# Patient Record
Sex: Female | Born: 1995 | Race: Black or African American | Hispanic: No | Marital: Single | Smoking: Former smoker
Health system: Southern US, Community
[De-identification: ages and names within clinical notes are randomized; demographics above are authoritative.]

## PROBLEM LIST (undated history)

## (undated) ENCOUNTER — Inpatient Hospital Stay (HOSPITAL_COMMUNITY): Payer: Self-pay

## (undated) DIAGNOSIS — B20 Human immunodeficiency virus [HIV] disease: Secondary | ICD-10-CM

## (undated) DIAGNOSIS — R000 Tachycardia, unspecified: Secondary | ICD-10-CM

## (undated) HISTORY — PX: NO PAST SURGERIES: SHX2092

---
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
## 2015-02-03 NOTE — L&D Delivery Note (Signed)
Delivery Note Went to check on patient. She was having more pressure in her bottom. We performed a few test pushes with good movement. Given pre-term, NICU was notified.  At 10:59 PM a viable female was delivered via Vaginal, Spontaneous Delivery (Presentation: ; Occiput Anterior). Infant was dusky without a cry on delivery. Umbilical cord was immediately clamped x2 and cut per neonatologist's prior request and infant was transferred to warmer immediately for evaluation/management.   APGAR: 3, 7; weight: 1.96kg.   Placenta status: Intact, Spontaneous, some concerns for abruption given the appearance of the placenta  Pathology.  Cord: 3 vessels with the following complications: None.  Cord pH: 7.037  Anesthesia: Epidural  Episiotomy:  N/A Lacerations:  N/A  Suture Repair: N/A Est. Blood Loss (mL):   Mom to postpartum.  Baby to NICU. Placenta to pathology.   Crystal Dorsey 03/27/2015, 11:45 PM  Patient is a G1 at [redacted]w[redacted]d who was admitted w/ preterm labor, significant hx of HIV and scant prenatal care.  She progressed without augmentation however her membranes were artificially ruptured once she was 9cm and they were bulging from her introitus.  I was gloved and present for delivery in its entirety.  Second stage of labor progressed,  mild decels during second stage noted.  Complications: none; cord clamped and cut and infant to awaiting peds team for eval and tx  Lacerations: none  EBL: 500cc  SHAW, KIMBERLY, CNM 7:33 PM

## 2015-02-03 NOTE — L&D Delivery Note (Signed)
Delivery Note At 11:00 PM a viable female was delivered via Vaginal, Spontaneous Delivery (Presentation: vertex ; LOA  ).  APGAR: 8, 9; weight pending .   Placenta status: intact , with gentle traction .  Cord: 3 vessel with the following complications: none.  Cord pH: not collected  Anesthesia:  epidrual Episiotomy: None Lacerations: Periurethral Est. Blood Loss (mL): 100  Mom to postpartum.  Baby to Couplet care / Skin to Skin.  Ernestina Pennaicholas Pattye Meda 01/04/2016, 11:32 PM

## 2015-03-02 ENCOUNTER — Encounter (HOSPITAL_COMMUNITY): Payer: Self-pay | Admitting: *Deleted

## 2015-03-02 ENCOUNTER — Inpatient Hospital Stay (HOSPITAL_COMMUNITY)
Admission: AD | Admit: 2015-03-02 | Discharge: 2015-03-02 | Disposition: A | Discharge status: Home / Self Care | Payer: Medicaid Other | Source: Ambulatory Visit | Attending: Obstetrics & Gynecology | Admitting: Obstetrics & Gynecology

## 2015-03-02 DIAGNOSIS — O98813 Other maternal infectious and parasitic diseases complicating pregnancy, third trimester: Secondary | ICD-10-CM | POA: Insufficient documentation

## 2015-03-02 DIAGNOSIS — Z21 Asymptomatic human immunodeficiency virus [HIV] infection status: Secondary | ICD-10-CM | POA: Diagnosis not present

## 2015-03-02 DIAGNOSIS — O9989 Other specified diseases and conditions complicating pregnancy, childbirth and the puerperium: Secondary | ICD-10-CM | POA: Diagnosis not present

## 2015-03-02 DIAGNOSIS — B373 Candidiasis of vulva and vagina: Secondary | ICD-10-CM | POA: Insufficient documentation

## 2015-03-02 DIAGNOSIS — Z87891 Personal history of nicotine dependence: Secondary | ICD-10-CM | POA: Insufficient documentation

## 2015-03-02 DIAGNOSIS — R109 Unspecified abdominal pain: Secondary | ICD-10-CM

## 2015-03-02 DIAGNOSIS — O98713 Human immunodeficiency virus [HIV] disease complicating pregnancy, third trimester: Secondary | ICD-10-CM | POA: Insufficient documentation

## 2015-03-02 DIAGNOSIS — O26899 Other specified pregnancy related conditions, unspecified trimester: Secondary | ICD-10-CM

## 2015-03-02 DIAGNOSIS — Z3A29 29 weeks gestation of pregnancy: Secondary | ICD-10-CM | POA: Diagnosis not present

## 2015-03-02 DIAGNOSIS — B3731 Acute candidiasis of vulva and vagina: Secondary | ICD-10-CM

## 2015-03-02 HISTORY — DX: Human immunodeficiency virus (HIV) disease: B20

## 2015-03-02 LAB — URINALYSIS, ROUTINE W REFLEX MICROSCOPIC
BILIRUBIN URINE: NEGATIVE
GLUCOSE, UA: NEGATIVE mg/dL
HGB URINE DIPSTICK: NEGATIVE
Ketones, ur: 15 mg/dL — AB
NITRITE: NEGATIVE
PH: 6.5 (ref 5.0–8.0)
Protein, ur: NEGATIVE mg/dL
SPECIFIC GRAVITY, URINE: 1.015 (ref 1.005–1.030)

## 2015-03-02 LAB — WET PREP, GENITAL
CLUE CELLS WET PREP: NONE SEEN
SPERM: NONE SEEN
Trich, Wet Prep: NONE SEEN

## 2015-03-02 LAB — URINE MICROSCOPIC-ADD ON

## 2015-03-02 NOTE — MAU Provider Note (Signed)
MAU HISTORY AND PHYSICAL  Chief Complaint:  abd pain  Sherry Robbins is a 20 y.o.  G1P0 with IUP at [redacted]w[redacted]d by 1st tri u/s (per patient) presenting for abd pain.  Has been cared for this pregnancy at [facility], but wants to transfer care to something in the area.  Upset stomach for a few days. Like urge to defecate. Loose stools for few days. Three times in one day, one stool today. No pain currently. Having intermittent contractions for a few weeks, few times a day. Positive fetal movements, no LOF, no bleeding. No fevers, no vomiting. No blood in stool. No current concerns. No dysuria or hematuria. No back pain.  On medication for HIV, followed by ID @ [facility] in [place].  Nothing in vagina past 24 hours.  Does cite increase in vaginal discharge past few weeks. No pain or burning.    Past Medical History  Diagnosis Date  . HIV disease [facility])     Past Surgical History  Procedure Laterality Date  . No past surgeries      Family History  Problem Relation Age of Onset  . Cancer Maternal Grandmother     Social History  Substance Use Topics  . Smoking status: Former Smoker    Quit date: 08/30/2014  . Smokeless tobacco: None  . Alcohol Use: Yes     Comment: social    Allergies  Allergen Reactions  . Ceftriaxone Other (See Comments)    Reaction:  Fever and cytopenias  . Cephalexin Other (See Comments)    Reaction:  Fever and cytopenias  . Zithromax [Azithromycin] Other (See Comments)    Reaction:  Fever and cytopenias    Prescriptions prior to admission  Medication Sig Dispense Refill Last Dose  . emtricitabine-tenofovir AF (DESCOVY) 200-25 MG tablet Take 1 tablet by mouth daily with supper.    03/01/2015 at Unknown time  . Prenatal Vit-Min-FA-Fish Oil ([facility] PRENATAL GUMMY PO) Take 2 each by mouth daily.   Past Week at Unknown time  . rilpivirine (EDURANT) 25 MG TABS tablet Take 25 mg by mouth daily with supper.   03/01/2015 at Unknown time    Review of Systems -  Negative except for what is mentioned in HPI.  Physical Exam  Blood pressure 119/67, pulse 104, temperature 98.5 F (36.9 C), temperature source Oral, resp. rate 16, last menstrual period 04/27/2014. GENERAL: Well-developed, well-nourished female in no acute distress.  LUNGS: Clear to auscultation bilaterally.  HEART: Regular rate and rhythm. ABDOMEN: Soft, nontender, nondistended, gravid.  EXTREMITIES: Nontender, no edema, 2+ distal pulses. Cervical Exam: closed/thick/high FHT:  135/mod/+a/-d Contractions: quiet   Labs: Results for orders placed or performed during the hospital encounter of 03/02/15 (from the past 24 hour(s))  Urinalysis, Routine w reflex microscopic (not at [facility])   Collection Time: 03/02/15  4:13 PM  Result Value Ref Range   Color, Urine YELLOW YELLOW   APPearance CLEAR CLEAR   Specific Gravity, Urine 1.015 1.005 - 1.030   pH 6.5 5.0 - 8.0   Glucose, UA NEGATIVE NEGATIVE mg/dL   Hgb urine dipstick NEGATIVE NEGATIVE   Bilirubin Urine NEGATIVE NEGATIVE   Ketones, ur 15 (A) NEGATIVE mg/dL   Protein, ur NEGATIVE NEGATIVE mg/dL   Nitrite NEGATIVE NEGATIVE   Leukocytes, UA SMALL (A) NEGATIVE  Urine microscopic-add on   Collection Time: 03/02/15  4:13 PM  Result Value Ref Range   Squamous Epithelial / LPF 0-5 (A) NONE SEEN   WBC, UA 6-30 0 - 5 WBC/hpf  RBC / HPF 0-5 0 - 5 RBC/hpf   Bacteria, UA FEW (A) NONE SEEN  Wet prep, genital   Collection Time: 03/02/15  4:15 PM  Result Value Ref Range   Yeast Wet Prep HPF POC PRESENT (A) NONE SEEN   Trich, Wet Prep NONE SEEN NONE SEEN   Clue Cells Wet Prep HPF POC NONE SEEN NONE SEEN   WBC, Wet Prep HPF POC MANY (A) NONE SEEN   Sperm NONE SEEN     Imaging Studies:  No results found.  Assessment: Sherry Robbins is  20 y.o. G1P0 at [redacted]w[redacted]d presents with recent abdominal pain and loose stools. Today no abdominal pain, stooling normally. Etiology unclear but appears to be resolved. Think unlikely PTL as does not  describe regular contractions, and cervix closed on exam. Uterus not tender and no vaginal bleeding, so unlikely abruption. Absence of pain today makes other serious intraabdominal processes, such as appendicitis or ovarian torsion, unlikely. Does not describe symptoms consistent with cystitis or pyelonephritis, and urinalysis today not suggestive of infection. Wet prep performed given increased vaginal discharge, showing yeast. NST reactive.  Plan: - d/c home with abdominal pain in pregnancy return precautions - referring to our high risk clinic given HIV positive status - OTC monostat for yeast infection  Silvano Bilis 1/28/20174:46 PM

## 2015-03-02 NOTE — MAU Note (Signed)
Pt had an upset stomach and experienced some tightening.  Diarrhea x2 days. Denies VB/LOF.

## 2015-03-02 NOTE — Discharge Instructions (Signed)
Monilial Vaginitis Vaginitis in a soreness, swelling and redness (inflammation) of the vagina and vulva. Monilial vaginitis is not a sexually transmitted infection. CAUSES  Yeast vaginitis is caused by yeast (candida) that is normally found in your vagina. With a yeast infection, the candida has overgrown in number to a point that upsets the chemical balance. SYMPTOMS   White, thick vaginal discharge.  Swelling, itching, redness and irritation of the vagina and possibly the lips of the vagina (vulva).  Burning or painful urination.  Painful intercourse. DIAGNOSIS  Things that may contribute to monilial vaginitis are:  Postmenopausal and virginal states.  Pregnancy.  Infections.  Being tired, sick or stressed, especially if you had monilial vaginitis in the past.  Diabetes. Good control will help lower the chance.  Birth control pills.  Tight fitting garments.  Using bubble bath, feminine sprays, douches or deodorant tampons.  Taking certain medications that kill germs (antibiotics).  Sporadic recurrence can occur if you become ill. TREATMENT  Your caregiver will give you medication.  There are several kinds of anti monilial vaginal creams and suppositories specific for monilial vaginitis. For recurrent yeast infections, use a suppository or cream in the vagina 2 times a week, or as directed.  Anti-monilial or steroid cream for the itching or irritation of the vulva may also be used. Get your caregiver's permission.  Painting the vagina with methylene blue solution may help if the monilial cream does not work.  Eating yogurt may help prevent monilial vaginitis. HOME CARE INSTRUCTIONS   Finish all medication as prescribed.  Do not have sex until treatment is completed or after your caregiver tells you it is okay.  Take warm sitz baths.  Do not douche.  Do not use tampons, especially scented ones.  Wear cotton underwear.  Avoid tight pants and panty  hose.  Tell your sexual partner that you have a yeast infection. They should go to their caregiver if they have symptoms such as mild rash or itching.  Your sexual partner should be treated as well if your infection is difficult to eliminate.  Practice safer sex. Use condoms.  Some vaginal medications cause latex condoms to fail. Vaginal medications that harm condoms are:  Cleocin cream.  Butoconazole (Femstat).  Terconazole (Terazol) vaginal suppository.  Miconazole (Monistat) (may be purchased over the counter). SEEK MEDICAL CARE IF:   You have a temperature by mouth above 102 F (38.9 C).  The infection is getting worse after 2 days of treatment.  The infection is not getting better after 3 days of treatment.  You develop blisters in or around your vagina.  You develop vaginal bleeding, and it is not your menstrual period.  You have pain when you urinate.  You develop intestinal problems.  You have pain with sexual intercourse.   This information is not intended to replace advice given to you by your health care provider. Make sure you discuss any questions you have with your health care provider.   Document Released: 10/29/2004 Document Revised: 04/13/2011 Document Reviewed: 07/23/2014 Elsevier Interactive Patient Education 2016 [employer] Abdominal Pain During Pregnancy Belly (abdominal) pain is common during pregnancy. Most of the time, it is not a serious problem. Other times, it can be a sign that something is wrong with the pregnancy. Always tell your doctor if you have belly pain. HOME CARE Monitor your belly pain for any changes. The following actions may help you feel better:  Do not have sex (intercourse) or put anything in your vagina until  until you feel better. °· Rest until your pain stops. °· Drink clear fluids if you feel sick to your stomach (nauseous). Do not eat solid food until you feel better. °· Only take medicine as told by your  doctor. °· Keep all doctor visits as told. °GET HELP RIGHT AWAY IF:  °· You are bleeding, leaking fluid, or pieces of tissue come out of your vagina. °· You have more pain or cramping. °· You keep throwing up (vomiting). °· You have pain when you pee (urinate) or have blood in your pee. °· You have a fever. °· You do not feel your baby moving as much. °· You feel very weak or feel like passing out. °· You have trouble breathing, with or without belly pain. °· You have a very bad headache and belly pain. °· You have fluid leaking from your vagina and belly pain. °· You keep having watery poop (diarrhea). °· Your belly pain does not go away after resting, or the pain gets worse. °MAKE SURE YOU:  °· Understand these instructions. °· Will watch your condition. °· Will get help right away if you are not doing well or get worse. °  °This information is not intended to replace advice given to you by your health care provider. Make sure you discuss any questions you have with your health care provider. °  °Document Released: 01/07/2009 Document Revised: 09/21/2012 Document Reviewed: 08/18/2012 °Elsevier Interactive Patient Education ©2016 Elsevier Inc. ° °

## 2015-03-27 ENCOUNTER — Inpatient Hospital Stay (HOSPITAL_COMMUNITY)
Admission: AD | Admit: 2015-03-27 | Discharge: 2015-03-29 | DRG: 774 | Disposition: A | Discharge status: Home / Self Care | Payer: Medicaid Other | Source: Ambulatory Visit | Attending: Obstetrics and Gynecology | Admitting: Obstetrics and Gynecology

## 2015-03-27 ENCOUNTER — Inpatient Hospital Stay (HOSPITAL_COMMUNITY): Payer: Medicaid Other | Admitting: Anesthesiology

## 2015-03-27 ENCOUNTER — Encounter (HOSPITAL_COMMUNITY): Payer: Self-pay | Admitting: *Deleted

## 2015-03-27 DIAGNOSIS — O458X3 Other premature separation of placenta, third trimester: Secondary | ICD-10-CM | POA: Diagnosis present

## 2015-03-27 DIAGNOSIS — O9812 Syphilis complicating childbirth: Secondary | ICD-10-CM | POA: Diagnosis present

## 2015-03-27 DIAGNOSIS — O9902 Anemia complicating childbirth: Secondary | ICD-10-CM | POA: Diagnosis present

## 2015-03-27 DIAGNOSIS — O98713 Human immunodeficiency virus [HIV] disease complicating pregnancy, third trimester: Secondary | ICD-10-CM | POA: Diagnosis present

## 2015-03-27 DIAGNOSIS — B20 Human immunodeficiency virus [HIV] disease: Secondary | ICD-10-CM

## 2015-03-27 DIAGNOSIS — Z87891 Personal history of nicotine dependence: Secondary | ICD-10-CM

## 2015-03-27 DIAGNOSIS — Z3A33 33 weeks gestation of pregnancy: Secondary | ICD-10-CM

## 2015-03-27 DIAGNOSIS — Z21 Asymptomatic human immunodeficiency virus [HIV] infection status: Secondary | ICD-10-CM | POA: Diagnosis present

## 2015-03-27 HISTORY — DX: Tachycardia, unspecified: R00.0

## 2015-03-27 LAB — URINALYSIS, ROUTINE W REFLEX MICROSCOPIC
Bilirubin Urine: NEGATIVE
GLUCOSE, UA: NEGATIVE mg/dL
Ketones, ur: NEGATIVE mg/dL
Nitrite: NEGATIVE
PH: 5 (ref 5.0–8.0)
PROTEIN: 30 mg/dL — AB
SPECIFIC GRAVITY, URINE: 1.025 (ref 1.005–1.030)

## 2015-03-27 LAB — URINE MICROSCOPIC-ADD ON

## 2015-03-27 LAB — BASIC METABOLIC PANEL
ANION GAP: 10 (ref 5–15)
CHLORIDE: 108 mmol/L (ref 101–111)
CO2: 19 mmol/L — ABNORMAL LOW (ref 22–32)
Calcium: 8.3 mg/dL — ABNORMAL LOW (ref 8.9–10.3)
Creatinine, Ser: 0.52 mg/dL (ref 0.44–1.00)
GFR calc Af Amer: >60 mL/min (ref >60)
Glucose, Bld: 106 mg/dL — ABNORMAL HIGH (ref 65–99)
POTASSIUM: 3.4 mmol/L — AB (ref 3.5–5.1)
SODIUM: 137 mmol/L (ref 135–145)

## 2015-03-27 LAB — FERRITIN: FERRITIN: 7 ng/mL — AB (ref 11–307)

## 2015-03-27 LAB — CBC
HCT: 25.1 % — ABNORMAL LOW (ref 36.0–46.0)
Hemoglobin: 7.4 g/dL — ABNORMAL LOW (ref 12.0–15.0)
MCH: 19.1 pg — ABNORMAL LOW (ref 26.0–34.0)
MCHC: 29.5 g/dL — ABNORMAL LOW (ref 30.0–36.0)
MCV: 64.7 fL — ABNORMAL LOW (ref 78.0–100.0)
PLATELETS: 409 10*3/uL — AB (ref 150–400)
RBC: 3.88 MIL/uL (ref 3.87–5.11)
RDW: 19.6 % — AB (ref 11.5–15.5)
WBC: 10.9 10*3/uL — AB (ref 4.0–10.5)

## 2015-03-27 LAB — OB RESULTS CONSOLE GBS: STREP GROUP B AG: NEGATIVE

## 2015-03-27 LAB — PREPARE RBC (CROSSMATCH)

## 2015-03-27 MED ORDER — ONDANSETRON HCL 4 MG/2ML IJ SOLN
4.0000 mg | Freq: Four times a day (QID) | INTRAMUSCULAR | Status: DC
  Filled 2015-03-27: qty 2

## 2015-03-27 MED ORDER — LACTATED RINGERS IV SOLN
500.0000 mL | INTRAVENOUS | Status: DC | PRN
  Administered 2015-03-27: 300 mL via INTRAVENOUS
  Administered 2015-03-27: 500 mL via INTRAVENOUS

## 2015-03-27 MED ORDER — MAGNESIUM SULFATE 50 % IJ SOLN
2.5000 g/h | INTRAMUSCULAR | Status: DC
  Filled 2015-03-27: qty 80

## 2015-03-27 MED ORDER — EPHEDRINE 5 MG/ML INJ
10.0000 mg | INTRAVENOUS | Status: DC | PRN
  Filled 2015-03-27: qty 2

## 2015-03-27 MED ORDER — PHENYLEPHRINE 40 MCG/ML (10ML) SYRINGE FOR IV PUSH (FOR BLOOD PRESSURE SUPPORT)
80.0000 ug | PREFILLED_SYRINGE | INTRAVENOUS | Status: DC | PRN
  Filled 2015-03-27: qty 2
  Filled 2015-03-27: qty 20

## 2015-03-27 MED ORDER — LIDOCAINE HCL (PF) 1 % IJ SOLN
30.0000 mL | INTRAMUSCULAR | Status: DC | PRN
  Filled 2015-03-27: qty 30

## 2015-03-27 MED ORDER — SODIUM CHLORIDE 0.9 % IV SOLN
1.0000 g | INTRAVENOUS | Status: DC
  Administered 2015-03-27 (×2): 1 g via INTRAVENOUS
  Filled 2015-03-27 (×7): qty 1000

## 2015-03-27 MED ORDER — PHENYLEPHRINE 40 MCG/ML (10ML) SYRINGE FOR IV PUSH (FOR BLOOD PRESSURE SUPPORT)
80.0000 ug | PREFILLED_SYRINGE | INTRAVENOUS | Status: DC | PRN
  Filled 2015-03-27: qty 2

## 2015-03-27 MED ORDER — CITRIC ACID-SODIUM CITRATE 334-500 MG/5ML PO SOLN: 30.0000 mL | ORAL | Status: DC | PRN

## 2015-03-27 MED ORDER — NIFEDIPINE 10 MG PO CAPS: 20.0000 mg | ORAL_CAPSULE | Freq: Four times a day (QID) | ORAL | Status: DC

## 2015-03-27 MED ORDER — LIDOCAINE HCL (PF) 1 % IJ SOLN
INTRAMUSCULAR | Status: DC | PRN
  Administered 2015-03-27: 6 mL
  Administered 2015-03-27: 5 mL

## 2015-03-27 MED ORDER — EMTRICITABINE-TENOFOVIR AF 200-25 MG PO TABS
1.0000 | ORAL_TABLET | Freq: Every day | ORAL | Status: DC
  Administered 2015-03-27 – 2015-03-29 (×3): 1 via ORAL
  Filled 2015-03-27 (×3): qty 1

## 2015-03-27 MED ORDER — MISOPROSTOL 200 MCG PO TABS
800.0000 ug | ORAL_TABLET | Freq: Once | ORAL | Status: AC
  Administered 2015-03-27: 800 ug via ORAL

## 2015-03-27 MED ORDER — OXYTOCIN BOLUS FROM INFUSION
500.0000 mL | INTRAVENOUS | Status: DC
  Administered 2015-03-27: 500 mL via INTRAVENOUS

## 2015-03-27 MED ORDER — LABETALOL HCL 5 MG/ML IV SOLN: 20.0000 mg | INTRAVENOUS | Status: DC | PRN

## 2015-03-27 MED ORDER — MISOPROSTOL 200 MCG PO TABS
ORAL_TABLET | ORAL | Status: AC
  Filled 2015-03-27: qty 4

## 2015-03-27 MED ORDER — LACTATED RINGERS IV SOLN
INTRAVENOUS | Status: DC
  Administered 2015-03-27: 11:00:00 via INTRAVENOUS

## 2015-03-27 MED ORDER — LACTATED RINGERS IV SOLN: 500.0000 mL | Freq: Once | INTRAVENOUS | Status: DC

## 2015-03-27 MED ORDER — ACETAMINOPHEN 325 MG PO TABS: 650.0000 mg | ORAL_TABLET | ORAL | Status: DC | PRN

## 2015-03-27 MED ORDER — RILPIVIRINE HCL 25 MG PO TABS
25.0000 mg | ORAL_TABLET | Freq: Every day | ORAL | Status: DC
  Administered 2015-03-27 – 2015-03-29 (×3): 25 mg via ORAL
  Filled 2015-03-27 (×3): qty 1

## 2015-03-27 MED ORDER — FENTANYL CITRATE (PF) 100 MCG/2ML IJ SOLN
100.0000 ug | INTRAMUSCULAR | Status: DC | PRN
  Administered 2015-03-27: 100 ug via INTRAVENOUS
  Filled 2015-03-27: qty 2

## 2015-03-27 MED ORDER — OXYTOCIN 10 UNIT/ML IJ SOLN
2.5000 [IU]/h | INTRAVENOUS | Status: DC
  Filled 2015-03-27: qty 4

## 2015-03-27 MED ORDER — DIPHENHYDRAMINE HCL 50 MG/ML IJ SOLN: 12.5000 mg | INTRAMUSCULAR | Status: DC | PRN

## 2015-03-27 MED ORDER — LACTATED RINGERS IV SOLN
INTRAVENOUS | Status: DC
  Administered 2015-03-27: 16:00:00 via INTRAVENOUS

## 2015-03-27 MED ORDER — ZIDOVUDINE 10 MG/ML IV SOLN
1.0000 mg/kg/h | INTRAVENOUS | Status: DC
  Administered 2015-03-27 (×2): 1 mg/kg/h via INTRAVENOUS
  Filled 2015-03-27 (×2): qty 40

## 2015-03-27 MED ORDER — ZIDOVUDINE 10 MG/ML IV SOLN
2.0000 mg/kg | Freq: Once | INTRAVENOUS | Status: AC
  Administered 2015-03-27: 112 mg via INTRAVENOUS
  Filled 2015-03-27: qty 11.2

## 2015-03-27 MED ORDER — BETAMETHASONE SOD PHOS & ACET 6 (3-3) MG/ML IJ SUSP
12.0000 mg | INTRAMUSCULAR | Status: DC
  Administered 2015-03-27: 12 mg via INTRAMUSCULAR
  Filled 2015-03-27 (×2): qty 2

## 2015-03-27 MED ORDER — MAGNESIUM SULFATE BOLUS VIA INFUSION
6.0000 g | Freq: Once | INTRAVENOUS | Status: AC
Start: 2015-03-27 — End: 2015-03-27
  Administered 2015-03-27: 6 g via INTRAVENOUS
  Filled 2015-03-27: qty 500

## 2015-03-27 MED ORDER — ONDANSETRON HCL 4 MG/2ML IJ SOLN: 4.0000 mg | Freq: Four times a day (QID) | INTRAMUSCULAR | Status: DC | PRN

## 2015-03-27 MED ORDER — SODIUM CHLORIDE 0.9 % IV SOLN
2.0000 g | Freq: Once | INTRAVENOUS | Status: AC
  Administered 2015-03-27: 2 g via INTRAVENOUS
  Filled 2015-03-27: qty 2000

## 2015-03-27 MED ORDER — FENTANYL 2.5 MCG/ML BUPIVACAINE 1/10 % EPIDURAL INFUSION (WH - ANES)
14.0000 mL/h | INTRAMUSCULAR | Status: DC | PRN
  Administered 2015-03-27 (×3): 14 mL/h via EPIDURAL
  Filled 2015-03-27 (×2): qty 125

## 2015-03-27 MED ORDER — NIFEDIPINE 10 MG PO CAPS
30.0000 mg | ORAL_CAPSULE | Freq: Once | ORAL | Status: AC
  Administered 2015-03-27: 30 mg via ORAL
  Filled 2015-03-27: qty 3

## 2015-03-27 MED ORDER — HYDRALAZINE HCL 20 MG/ML IJ SOLN: 10.0000 mg | Freq: Once | INTRAMUSCULAR | Status: DC | PRN

## 2015-03-27 NOTE — H&P (Signed)
OBSTETRIC ADMISSION HISTORY AND PHYSICAL  Sherry Robbins is a 20 y.o. female G1P0 with IUP at [redacted]w[redacted]d presenting for contractions. She reports +FMs, No LOF, no VB, no blurry vision, headaches or peripheral edema.  She will be bottle feeding. She is undecided on method of birth control.  Dating: By LMP --->  Estimated Date of Delivery: 05/12/15  Prenatal History/Complications: Pt is HIV positive, diagnosed in March 2016. Followed by [facility] (Racheal Hyacinth Meeker). Has not seen ID since October 2016. Current medication regimen: Descovy (emtricitabine-tenofovir) 200-25 mg daily and Edurant (rilpivirine) 25 mg daily. Her last viral load was 24.5 on 11/14/14.   Past Medical History: Past Medical History  Diagnosis Date  . HIV disease [facility])     Past Surgical History: Past Surgical History  Procedure Laterality Date  . No past surgeries      Obstetrical History: OB History    Gravida Para Term Preterm AB TAB SAB Ectopic Multiple Living   1               Social History: Social History   Social History  . Marital Status: Single    Spouse Name: N/A  . Number of Children: N/A  . Years of Education: N/A   Social History Main Topics  . Smoking status: Former Smoker    Quit date: 08/30/2014  . Smokeless tobacco: None  . Alcohol Use: Yes     Comment: social  . Drug Use: Yes    Special: Marijuana  . Sexual Activity: Not Asked   Other Topics Concern  . None   Social History Narrative    Family History: Family History  Problem Relation Age of Onset  . Cancer Maternal Grandmother     Allergies: Allergies  Allergen Reactions  . Ceftriaxone Other (See Comments)    Reaction:  Fever and cytopenias  . Cephalexin Other (See Comments)    Reaction:  Fever and cytopenias  . Zithromax [Azithromycin] Other (See Comments)    Reaction:  Fever and cytopenias    Prescriptions prior to admission  Medication Sig Dispense Refill Last Dose  . emtricitabine-tenofovir AF (DESCOVY) 200-25  MG tablet Take 1 tablet by mouth daily with supper.    03/26/2015 at 2000  . miconazole (MONISTAT 7 SIMPLY CURE) 2 % vaginal cream Place 1 Applicatorful vaginally at bedtime.   Past Week at Unknown time  . Prenatal Vit-Min-FA-Fish Oil ([employer] PRENATAL GUMMY PO) Take 2 each by mouth daily.   Past Month at Unknown time  . rilpivirine (EDURANT) 25 MG TABS tablet Take 25 mg by mouth daily with supper.   03/26/2015 at 2000     Review of Systems   All systems reviewed and negative except as stated in HPI  Blood pressure 112/69, pulse 111, temperature 98 F (36.7 C), temperature source Axillary, resp. rate 20, height  (1.626 m), weight 55.792 kg (123 lb), last menstrual period 04/27/2014. General appearance: alert, cooperative and no distress, in pain from contractions Lungs: clear to auscultation bilaterally Heart: regular rate and rhythm Abdomen: soft, non-tender; bowel sounds normal Extremities: Homans sign is negative, no sign of DVT Presentation: unsure Fetal monitoringBaseline: 145 bpm, Variability: Good {> 6 bpm), Accelerations: Reactive and Decelerations: Absent Uterine activityFrequency: Every 2-3 minutes Dilation: 6 Effacement (%): 100 Station: 0 Exam by:: [email or web address]   Prenatal labs: ABO, Rh: --/--/A POS (02/22 1103) Antibody: POS (02/22 1103) Rubella: unknown RPR:  nonreactive HBsAg:  neg  HIV:  Positive GBS:   unknown 1 hr Glucola  unknown Genetic screening  unknown Anatomy US: mostly normal. Echogenic focus in the left ventricle: slight association with fetal aneuploidy; specifically trisomy 21  Prenatal Transfer Tool  Maternal Diabetes: No Genetic Screening: Declined (cannot find in Epic) Maternal Ultrasounds/Referrals: Declined (cannot find in Epic) Fetal Ultrasounds or other Referrals:  Other:  Anatomy US: mostly normal. Echogenic focus in the left ventricle: slight association with fetal aneuploidy; specifically trisomy 21 Maternal Substance Abuse:   No Significant Maternal Medications:  Meds include: Other: Descovy and Edurant for HIV Significant Maternal Lab Results: Lab values include: HIV positive  Results for orders placed or performed during the hospital encounter of 03/27/15 (from the past 24 hour(s))  CBC   Collection Time: 03/27/15 11:03 AM  Result Value Ref Range   WBC 10.9 (H) 4.0 - 10.5 K/uL   RBC 3.88 3.87 - 5.11 MIL/uL   Hemoglobin 7.4 (L) 12.0 - 15.0 g/dL   HCT 16.1 (L) 09.6 - 04.5 %   MCV 64.7 (L) 78.0 - 100.0 fL   MCH 19.1 (L) 26.0 - 34.0 pg   MCHC 29.5 (L) 30.0 - 36.0 g/dL   RDW 40.9 (H) 81.1 - 91.4 %   Platelets 409 (H) 150 - 400 K/uL  Type and screen   Collection Time: 03/27/15 11:03 AM  Result Value Ref Range   ABO/RH(D) A POS    Antibody Screen POS    Sample Expiration 03/30/2015    Antibody Identification PENDING    DAT, IgG NEG     Patient Active Problem List   Diagnosis Date Noted  . HIV disease (HCC) 03/27/2015  . Preterm labor 03/27/2015  . Anemia 05/05/2014    Assessment: Sherry Robbins is a 20 y.o. G1P0 at [redacted]w[redacted]d admitting for preterm labor  #Labor: Progressing. Will give Betamethasone and tocolytic #Pain: No pain medication at this time #FWB: Category 1 #ID: PROM; give Ampicillin for GBS prophylaxis. HIV positive; continue Descovy  and Edurant anti retrovirals. Add AZT.  #MOF: Bottle #MOC: undecided #Circ: yes, outpatient  Sherry Robbins 03/27/2015, 1:06 PM  OB FELLOW HISTORY AND PHYSICAL ATTESTATION  I have seen and examined this patient; I agree with above documentation in the resident's note. PTL. Vomited nifedipine so giving mg for tocolysis. First dose BMZ given, second ordered for 24 hours. Ampicillin for GBS unknown. Confimred through care everywhere that in October viral load was essentially undetectable. Would have liked to see a more recent assessment of viral load, but given report of medication compliance and October results, think vaginal delivery save, though will  order AZT in addition to continuing antiretrovirals.    Sherry Robbins 03/27/2015, 2:29 PM

## 2015-03-27 NOTE — Consults (Signed)
  Anesthesia Pain Consult Note  Patient: Sherry Robbins, 20 y.o., female  Consult Requested by: Catalina Antigua, MD  Reason for Consult: CRNA pain rounds  Level of Consciousness: alert  Pain: current 0 pain goal 7 has epidural     [facility] 03/27/2015

## 2015-03-27 NOTE — MAU Note (Signed)
Pt presents to MAU with contractions that started at 3 this morning. PT states she has had PNC in [facility] a couple of months ago, but was referred here for high risk for HIV +. PT denies any vaginal bleeding or LOF

## 2015-03-27 NOTE — MAU Provider Note (Signed)
Called to room by RN.  Pt breathing with contractions, reports onset this am around 8:00. She reports good fetal movement, denies LOF, vaginal bleeding, vaginal itching/burning, urinary symptoms, h/a, dizziness, n/v, or fever/chills.    FFN collected with blind swab before exam.  Cervix 5/80/-1   Called NICU, likely pt not stable for transfer so Ok to admit to [facility].  Report to Shonna Chock, MD  [place], Daisi Kentner, CNM 2:03 PM

## 2015-03-27 NOTE — Anesthesia Procedure Notes (Signed)
Epidural Patient location during procedure: OB  Staffing Anesthesiologist: Sherrian Divers Performed by: anesthesiologist   Preanesthetic Checklist Completed: patient identified, site marked, surgical consent, pre-op evaluation, timeout performed, IV checked, risks and benefits discussed and monitors and equipment checked  Epidural Patient position: sitting Prep: DuraPrep Patient monitoring: heart rate and blood pressure Approach: midline Location: L3-L4 Injection technique: LOR saline  Needle:  Needle type: Tuohy  Needle gauge: 17 G Needle insertion depth: 3 cm Catheter type: closed end Catheter size: 19 Gauge Catheter at skin depth: 7 cm Test dose: negative  Assessment Events: blood not aspirated, injection not painful, no injection resistance, negative IV test and no paresthesia  Additional Notes Reason for block:procedure for pain

## 2015-03-27 NOTE — Progress Notes (Signed)
Acknowledge [place] consult.  Pt was laboring when I spoke with her nurse and the request was not urgent.  We will follow up after delivery.  Chaplain Dyanne Carrel, Bcc Pager, [phone] 4:36 PM    03/27/15 1600  Clinical Encounter Type  Visited With Health care provider

## 2015-03-27 NOTE — Anesthesia Preprocedure Evaluation (Addendum)
Anesthesia Evaluation  Patient identified by MRN, date of birth, ID band Patient awake  General Assessment Comment:HIV positive, under good control  Reviewed: Allergy & Precautions, NPO status , Patient's Chart, lab work & pertinent test results  Airway Mallampati: II  TM Distance: >3 FB Neck ROM: Full    Dental no notable dental hx.    Pulmonary former smoker,    Pulmonary exam normal breath sounds clear to auscultation       Cardiovascular negative cardio ROS Normal cardiovascular exam Rhythm:Regular Rate:Normal     Neuro/Psych negative neurological ROS  negative psych ROS   GI/Hepatic negative GI ROS, Neg liver ROS,   Endo/Other  negative endocrine ROS  Renal/GU negative Renal ROS  negative genitourinary   Musculoskeletal negative musculoskeletal ROS (+)   Abdominal   Peds negative pediatric ROS (+)  Hematology  (+) anemia , Plt 409K  Hgb 7.4   Anesthesia Other Findings   Reproductive/Obstetrics negative OB ROS                            Anesthesia Physical Anesthesia Plan  ASA: II  Anesthesia Plan: Epidural   Post-op Pain Management:    Induction: Intravenous  Airway Management Planned: Natural Airway  Additional Equipment:   Intra-op Plan:   Post-operative Plan:   Informed Consent: I have reviewed the patients History and Physical, chart, labs and discussed the procedure including the risks, benefits and alternatives for the proposed anesthesia with the patient or authorized representative who has indicated his/her understanding and acceptance.   Dental advisory given  Plan Discussed with: CRNA  Anesthesia Plan Comments: (Informed consent obtained prior to proceeding including risk of failure, 1% risk of PDPH, risk of minor discomfort and bruising.  Discussed rare but serious complications including epidural abscess, permanent nerve injury, epidural hematoma.   Discussed alternatives to epidural analgesia and patient desires to proceed.  Timeout performed pre-procedure verifying patient name, procedure, and platelet count.  Patient tolerated procedure well. )       Anesthesia Quick Evaluation

## 2015-03-27 NOTE — Progress Notes (Signed)
Sherry Robbins is a 20 y.o. G1P0 at [redacted]w[redacted]d admitted for Preterm labor  Subjective: Feeling pressure w/ ctx  Objective: BP 130/78 mmHg  Pulse 107  Temp(Src) 97.9 F (36.6 C) (Axillary)  Resp 16  Ht  (1.626 m)  Wt 55.792 kg (123 lb)  BMI 21.10 kg/m2  SpO2 100%  LMP 04/27/2014 I/O last 3 completed shifts: In: 3053.9 [P.O.:125; I.V.:2715.1; Other:2.6; IV Piggyback:211.2] Out: 900 [Urine:900] Total I/O In: 407.3 [P.O.:125; I.V.:282.3] Out: 100 [Urine:100]  FHT:  FHR: 140s bpm, variability: minimal ,  accelerations:  Abscent,  decelerations:  Absent UC:   irreg SVE:   Dilation: 9 Effacement (%): 100 Station: 0 Exam by:: Pincus Badder, CNM- BBOW  Labs: Lab Results  Component Value Date   WBC 10.9* 03/27/2015   HGB 7.4* 03/27/2015   HCT 25.1* 03/27/2015   MCV 64.7* 03/27/2015   PLT 409* 03/27/2015    Assessment / Plan: IUP@33 .3wks Preterm labor- active phase GBS unk HIV pos  Will continue to obs and try to keep comfortable; will notify us w/ increased pressure Anticipate SVD  Samiksha Pellicano CNM 03/27/2015, 8:55 PM

## 2015-03-28 ENCOUNTER — Encounter (HOSPITAL_COMMUNITY): Payer: Self-pay | Admitting: *Deleted

## 2015-03-28 LAB — HIV-1 RNA QUANT-NO REFLEX-BLD: LOG10 HIV-1 RNA: UNDETERMINED log10copy/mL

## 2015-03-28 LAB — CBC
HCT: 25.8 % — ABNORMAL LOW (ref 36.0–46.0)
Hemoglobin: 8.5 g/dL — ABNORMAL LOW (ref 12.0–15.0)
MCH: 23.2 pg — AB (ref 26.0–34.0)
MCHC: 32.9 g/dL (ref 30.0–36.0)
MCV: 70.3 fL — AB (ref 78.0–100.0)
PLATELETS: 282 10*3/uL (ref 150–400)
RBC: 3.67 MIL/uL — AB (ref 3.87–5.11)
RDW: 22.6 % — ABNORMAL HIGH (ref 11.5–15.5)
WBC: 17.1 10*3/uL — ABNORMAL HIGH (ref 4.0–10.5)

## 2015-03-28 LAB — RPR, QUANT+TP ABS (REFLEX)
Rapid Plasma Reagin, Quant: 1:64 {titer} — ABNORMAL HIGH
T Pallidum Abs: POSITIVE — AB

## 2015-03-28 LAB — RUBELLA SCREEN: RUBELLA: 12.7 {index} (ref >0.99)

## 2015-03-28 LAB — RPR: RPR Ser Ql: REACTIVE — AB

## 2015-03-28 MED ORDER — TETANUS-DIPHTH-ACELL PERTUSSIS 5-2.5-18.5 LF-MCG/0.5 IM SUSP: 0.5000 mL | Freq: Once | INTRAMUSCULAR | Status: DC

## 2015-03-28 MED ORDER — WITCH HAZEL-GLYCERIN EX PADS: 1.0000 "application " | MEDICATED_PAD | CUTANEOUS | Status: DC | PRN

## 2015-03-28 MED ORDER — ONDANSETRON HCL 4 MG/2ML IJ SOLN: 4.0000 mg | INTRAMUSCULAR | Status: DC | PRN

## 2015-03-28 MED ORDER — ZOLPIDEM TARTRATE 5 MG PO TABS: 5.0000 mg | ORAL_TABLET | Freq: Every evening | ORAL | Status: DC | PRN

## 2015-03-28 MED ORDER — DIBUCAINE 1 % RE OINT: 1.0000 "application " | TOPICAL_OINTMENT | RECTAL | Status: DC | PRN

## 2015-03-28 MED ORDER — SODIUM CHLORIDE 0.9 % IV SOLN
Freq: Once | INTRAVENOUS | Status: AC
  Administered 2015-03-28: 08:00:00 via INTRAVENOUS

## 2015-03-28 MED ORDER — PRENATAL MULTIVITAMIN CH
1.0000 | ORAL_TABLET | Freq: Every day | ORAL | Status: DC
  Administered 2015-03-28 – 2015-03-29 (×2): 1 via ORAL
  Filled 2015-03-28 (×2): qty 1

## 2015-03-28 MED ORDER — SENNOSIDES-DOCUSATE SODIUM 8.6-50 MG PO TABS
2.0000 | ORAL_TABLET | ORAL | Status: DC
  Administered 2015-03-28 (×2): 2 via ORAL
  Filled 2015-03-28 (×2): qty 2

## 2015-03-28 MED ORDER — ONDANSETRON HCL 4 MG PO TABS: 4.0000 mg | ORAL_TABLET | ORAL | Status: DC | PRN

## 2015-03-28 MED ORDER — BENZOCAINE-MENTHOL 20-0.5 % EX AERO: 1.0000 "application " | INHALATION_SPRAY | CUTANEOUS | Status: DC | PRN

## 2015-03-28 MED ORDER — IBUPROFEN 600 MG PO TABS
600.0000 mg | ORAL_TABLET | Freq: Four times a day (QID) | ORAL | Status: DC
  Administered 2015-03-28 – 2015-03-29 (×7): 600 mg via ORAL
  Filled 2015-03-28 (×7): qty 1

## 2015-03-28 MED ORDER — LANOLIN HYDROUS EX OINT: TOPICAL_OINTMENT | CUTANEOUS | Status: DC | PRN

## 2015-03-28 MED ORDER — DIPHENHYDRAMINE HCL 25 MG PO CAPS: 25.0000 mg | ORAL_CAPSULE | Freq: Four times a day (QID) | ORAL | Status: DC | PRN

## 2015-03-28 MED ORDER — SIMETHICONE 80 MG PO CHEW: 80.0000 mg | CHEWABLE_TABLET | ORAL | Status: DC | PRN

## 2015-03-28 MED ORDER — ACETAMINOPHEN 325 MG PO TABS: 650.0000 mg | ORAL_TABLET | ORAL | Status: DC | PRN

## 2015-03-28 NOTE — Progress Notes (Signed)
Attempted to visit with patient to introduce spiritual care services and offer support after the birth of her child who is in the NICU.  Pt was sleeping when I attempted to visit.  Will attempt to follow up tomorrow.  Please page as further needs arise.  Maryanna Shape. Carley Hammed, M.Div. [facility] Chaplain Pager [phone] Office [phone]    03/28/15 1601  Clinical Encounter Type  Visited With Patient  Visit Type Initial

## 2015-03-28 NOTE — Progress Notes (Signed)
Post Partum Day 1 Subjective: no complaints, up ad lib, voiding, tolerating PO and + flatus. Denies any dizziness, lightheadedness, or palpitations. Her baby is in the NICU.   Objective: Blood pressure 122/57, pulse 95, temperature 99 F (37.2 C), temperature source Oral, resp. rate 16, height  (1.626 m), weight 55.792 kg (123 lb), last menstrual period 04/27/2014, SpO2 100 %.  Physical Exam:  General: alert, cooperative and no distress Lochia: appropriate Uterine Fundus: firm DVT Evaluation: No evidence of DVT seen on physical exam. Negative Homan's sign. No significant calf/ankle edema.   Recent Labs  03/27/15 1103 03/28/15 0534  HGB 7.4* 5.4*  HCT 25.1* 17.8*    Assessment/Plan: Social Work consult  Anemia: Will require 2 U of pRBCs today and post transfusion H/H.  HIV: Touch base with patient's ID physician   LOS: 1 day   Beaulah Dinning 03/28/2015, 7:43 AM

## 2015-03-28 NOTE — Anesthesia Postprocedure Evaluation (Signed)
Anesthesia Post Note  Patient: Sherry Robbins  Procedure(s) Performed: * No procedures listed *  Patient location during evaluation: Women's Unit Anesthesia Type: Epidural Level of consciousness: oriented and awake and alert Pain management: pain level controlled Vital Signs Assessment: post-procedure vital signs reviewed and stable Respiratory status: spontaneous breathing Cardiovascular status: blood pressure returned to baseline Postop Assessment: no headache, no backache, patient able to bend at knees, no signs of nausea or vomiting and adequate PO intake Anesthetic complications: no    Last Vitals:  Filed Vitals:   03/28/15 0804 03/28/15 0824  BP: 104/63 105/62  Pulse: 87 87  Temp: 36.8 C 37.1 C  Resp: 16 16    Last Pain:  Filed Vitals:   03/28/15 0827  PainSc: 0-No pain                 Cayde Held

## 2015-03-28 NOTE — Progress Notes (Signed)
CRITICAL VALUE ALERT  Critical value received:  Hemoglobin 5.4 g/dL  Date of notification:  03/28/2015  Time of notification:  0620  Critical value read back:Yes.     Nurse who received alert:  Kittie Plater  MD notified (1st page):  Clelia Croft  Time of first page:  0624, CNM  MD notified (2nd page):  Time of second page:  Responding MD:  Clelia Croft  Time MD responded:  [phone], CNM  Results for Sherry Robbins, Sherry Robbins (MRN [id number]) as of 03/28/2015 06:24   Ref. Range 03/27/2015 11:03 03/28/2015 05:34  Hemoglobin Latest Ref Range: 12.0-15.0 g/dL 7.4 (L) 5.4 (LL)  HCT Latest Ref Range: 36.0-46.0 % 25.1 (L) 17.8 (L)    Plan of care: proceed with administering 2 unit of PRBC.  Patient given Fact Sheet and made aware of plan of care.  Consent to be signed.

## 2015-03-29 ENCOUNTER — Telehealth (HOSPITAL_COMMUNITY): Payer: Self-pay

## 2015-03-29 LAB — CBC
HCT: 17.8 % — ABNORMAL LOW (ref 36.0–46.0)
Hemoglobin: 5.4 g/dL — CL (ref 12.0–15.0)
MCH: 19.3 pg — AB (ref 26.0–34.0)
MCHC: 30.3 g/dL (ref 30.0–36.0)
MCV: 63.6 fL — ABNORMAL LOW (ref 78.0–100.0)
PLATELETS: 300 10*3/uL (ref 150–400)
RBC: 2.8 MIL/uL — AB (ref 3.87–5.11)
RDW: 19.6 % — AB (ref 11.5–15.5)
WBC: 18 10*3/uL — AB (ref 4.0–10.5)

## 2015-03-29 LAB — CULTURE, BETA STREP (GROUP B ONLY)

## 2015-03-29 LAB — HIV-1 RNA QUANT-NO REFLEX-BLD
HIV 1 RNA Quant: <20 copies/mL
LOG10 HIV-1 RNA: UNDETERMINED log10copy/mL

## 2015-03-29 MED ORDER — IBUPROFEN 600 MG PO TABS: 600.0000 mg | ORAL_TABLET | Freq: Four times a day (QID) | ORAL | Status: DC | PRN

## 2015-03-29 MED ORDER — PENICILLIN G BENZATHINE 1200000 UNIT/2ML IM SUSP
2.4000 10*6.[IU] | Freq: Once | INTRAMUSCULAR | Status: AC
  Administered 2015-03-29: 2.4 10*6.[IU] via INTRAMUSCULAR
  Filled 2015-03-29: qty 4

## 2015-03-29 MED ORDER — FERROUS SULFATE 325 (65 FE) MG PO TABS
325.0000 mg | ORAL_TABLET | Freq: Two times a day (BID) | ORAL | Status: DC
Start: 2015-03-29 — End: 2016-01-04

## 2015-03-29 NOTE — Telephone Encounter (Signed)
Sherry Robbins w/state health dept calling regarding (+) RPR.  Demographics provided and informed pt admitted @ [facility].

## 2015-03-29 NOTE — Progress Notes (Signed)
Sherry Robbins was awake after a visit with the neonatologist and I was able to introduce spiritual care services.  She shared that she was a little sad after hearing that her baby would have to have some tests.  She shared that she hasn't held or touched [name] yet and that she's a little frightened to do so because he is so small.  I reassured her that the staff would guide and support her in doing this so that she can rest assured that she will not hurt her son.   Please page as further needs arise.  Maryanna Shape. Carley Hammed, M.Div. [facility] Chaplain Pager [phone] Office [phone]    03/29/15 1417  Clinical Encounter Type  Visited With Patient  Visit Type Initial;Spiritual support  Referral From Chaplain

## 2015-03-29 NOTE — Discharge Summary (Signed)
OB Discharge Summary     Patient Name: Sherry Robbins DOB: Jan 20, 1996 MRN: [id number]  Date of admission: 03/27/2015 Delivering MD: Rodrigo Ran S   Date of discharge: 03/29/2015  Admitting diagnosis: 33WKS, CTX Intrauterine pregnancy: [redacted]w[redacted]d     Secondary diagnosis:  Active Problems:   HIV disease (HCC)   Preterm labor  Additional problems: scant prenatal care ([facility]); severe anemia on arrival; +RPR discovered during inpt stay- tx     Discharge diagnosis: Preterm Pregnancy Delivered                                                                                                Post partum procedures:blood transfusionx 2 units  Augmentation: AROM  Complications: Placental Abruption  Hospital course:  Onset of Labor With Vaginal Delivery     20 y.o. yo G1P0101 at [redacted]w[redacted]d was admitted in Active Labor on 03/27/2015. She was started on mag sulfate for tocolysis, given BMZ x 1 dose, and AZT. Despite the mag, she progressed to complete and pushed to SVD. Patient had an uncomplicated labor course as follows:  Membrane Rupture Time/Date: 9:03 PM ,03/27/2015   Intrapartum Procedures: Episiotomy: None [1]                                         Lacerations:  None [1]  Patient had a delivery of a Viable infant. 03/27/2015  Information for the patient's newborn:  Macrina, Lehnert [[id number]]  Delivery Method: Vaginal, Spontaneous Delivery (Filed from Delivery Summary)    Pateint had an uncomplicated postpartum course. She had an EBL of 500cc during delivery as placenta appeared to have been abruption at delivery, and so she was transfused 2 units in the morning bringing her Hgb to 8.5.  A call from the state health dept on 2/24 made [place] aware of +RPR status. Pt tx w/ PCN. She is ambulating, tolerating a regular diet, passing flatus, and urinating well. Patient is discharged home in stable condition on 03/29/2015.    Physical exam  Filed Vitals:   03/28/15 1705 03/28/15 2213 03/29/15 0526  03/29/15 1200  BP: 112/65 119/63 97/61 106/64  Pulse: 77 75 59 79  Temp: 97.7 F (36.5 C) 98.7 F (37.1 C) 97.8 F (36.6 C) 98.1 F (36.7 C)  TempSrc: Oral Oral Oral Oral  Resp: Height:      Weight:      SpO2: 100% 100% 100% 100%   General: alert and cooperative Lochia: appropriate Uterine Fundus: firm Incision: N/A DVT Evaluation: No evidence of DVT seen on physical exam. Labs: Lab Results  Component Value Date   WBC 17.1* 03/28/2015   HGB 8.5* 03/28/2015   HCT 25.8* 03/28/2015   MCV 70.3* 03/28/2015   PLT 282 03/28/2015   CMP Latest Ref Rng 03/27/2015  Glucose 65 - 99 mg/dL 782(N)  BUN 6 - 20 mg/dL <5(A)  Creatinine 2.13 - 1.00 mg/dL 0.86  Sodium 578 - 469 mmol/L 137  Potassium 3.5 - 5.1  mmol/L 3.4(L)  Chloride 101 - 111 mmol/L 108  CO2 22 - 32 mmol/L 19(L)  Calcium 8.9 - 10.3 mg/dL 8.3(L)  Total Protein 6.0 - 8.3 g/dL -  Total Bilirubin 0.2 - 1.1 mg/dL -  Alkaline Phos 39 - 782 U/L -  AST 0 - 37 U/L -  ALT 0 - 35 U/L -    Discharge instruction: per After Visit Summary and "Baby and Me Booklet".  After visit meds:    Medication List    TAKE these medications        [employer]S PRENATAL GUMMY PO  Take 2 each by mouth daily.     DESCOVY 200-25 MG tablet  Generic drug:  emtricitabine-tenofovir AF  Take 1 tablet by mouth daily with supper.     ferrous sulfate 325 (65 FE) MG tablet  Take 1 tablet (325 mg total) by mouth 2 (two) times daily with a meal.     ibuprofen 600 MG tablet  Commonly known as:  ADVIL,MOTRIN  Take 1 tablet (600 mg total) by mouth every 6 (six) hours as needed.     MONISTAT 7 SIMPLY CURE 2 % vaginal cream  Generic drug:  miconazole  Place 1 Applicatorful vaginally at bedtime.     rilpivirine 25 MG Tabs tablet  Commonly known as:  EDURANT  Take 25 mg by mouth daily with supper.        Diet: routine diet  Activity: Advance as tolerated. Pelvic rest for 6 weeks.   Outpatient follow up:6 weeks for [facility]; ID  contact made for changing care to [place] Follow up Appt:No future appointments. Follow up Visit:No Follow-up on file.  Postpartum contraception: discussed- info provided; undecided  Newborn Data: Live born female  Birth Weight: 4 lb 5.1 oz (1960 g) APGAR: 3, 7  Baby Feeding: Bottle Disposition:NICU   03/29/2015 Cam Hai, CNM  4:44 PM

## 2015-03-29 NOTE — Progress Notes (Signed)
Post Partum Day 1 Subjective: no complaints, up ad lib, voiding, tolerating PO, + flatus and denies dizziness or lightheadedness. 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 Pt states she has no allergies to medicines. Pt agreed to inform all sexual partners during pregnancy. Discussed birth control options with patient as well.   Objective: Blood pressure 97/61, pulse 59, temperature 97.8 F (36.6 C), temperature source Oral, resp. rate 18, height  (1.626 m), weight 55.792 kg (123 lb), last menstrual period 04/27/2014, SpO2 100 %, unknown if currently breastfeeding.   Lab Results  Component Value Date   HIV1RNAQUANT <20 03/27/2015   Reactive (02/22 1103) RPR    Physical Exam:  General: alert, cooperative and no distress Lochia: appropriate Uterine Fundus: firm Incision: n/a DVT Evaluation: No evidence of DVT seen on physical exam. Negative Homan's sign. No significant calf/ankle edema.   Recent Labs  03/28/15 0534 03/28/15 1639  HGB 5.4* 8.5*  HCT 17.8* 25.8*    Assessment/Plan: HIV: continue current anti retroviral treatment (Descovey and Edurant). Viral load < 20 (undetectable) Anemia: Improved. 2 units given yesterday. Hgb 8.5 now.  RPR positive: will need abx. Per Epic patient allergic to Cetriaxone and Cephalexin. Will give Penicillin tx and observe Contraception: Will provide patient with list of contraceptive options for her to review   LOS: 2 days   Beaulah Dinning 03/29/2015, 9:04 AM   Attestation of Attending Supervision of Resident: Evaluation and management procedures were performed by the [facility] Resident under my supervision.  I have seen and examined the patient, reviewed the resident's note and chart, and I agree with the management and plan.  Pt to transfer to ID in [place] and will f/u at [facility] for PP care.   Anibal Henderson, M.D. 03/29/2015 9:56 AM

## 2015-03-29 NOTE — Clinical Social Work Maternal (Signed)
CLINICAL SOCIAL WORK MATERNAL/CHILD NOTE  Patient Details  Name: Sherry Robbins MRN: [id number] Date of Birth: 01/06/1996  Date:  2015-11-20  Clinical Social Worker Initiating Note:  Ido Wollman E. 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, NICU admission)   Referral Source:  NICU   Address:  [address] [place], [place], [place] [address]  Phone number:  [phone]   Household Members:  Parents (MOB states she lives with her mother and her mother's boyfriend.)   Natural Supports (not living in the home):   (MOB reports that her mother is her only support person and that, "I don't really like bothering people.")   Professional Supports:     Employment:     Type of Work:  (MOB was working at [employer] and plans to return when medically cleared by [employer].)   Education:      Museum/gallery curator Resources:  Medicaid   Other Resources:   (MOB plans to apply for [employer].  CSW suggests she speak with her mother about applying for Food Stamps as MOB states her mother had them temporarily.  )   Cultural/Religious Considerations Which May Impact Care: None stated.  MOB's facesheet notes religion as Psychologist, forensic.  Strengths:  Ability to meet basic needs    Risk Factors/Current Problems:  Mental Health Concerns , Transportation , Substance Use  (Hx of Anxiety, Hx of Marijuana use.)   Cognitive State:  Alert , Able to Concentrate , Goal Oriented , Linear Thinking    Mood/Affect:  Interested , Calm , Relaxed    CSW Assessment: CSW met with MOB in her third floor room to introduce services, offer support, and complete assessment due to baby's admission to NICU and exposure to HIV.  MOB was quiet, but pleasant and welcoming of CSW's visit.  She was easily engaged and seemed open to talking with  CSW. MOB reports that she is "doing good" and "excited and nervous" about becoming a mother.  She does not feel like it has completely set in yet, and that this experience has felt like a dream.  She seems calm and relaxed regarding baby's premature birth and admission to NICU.  She reports, "they told me he is doing fine."  CSW asked MOB to elaborate on baby's condition and what she has been told.  She informed CSW that his lungs were not fully developed because he was premature so he initially had a breathing tube.  She states that when they took the breathing tube out, they thought he might be having seizures so they did an EKG.  CSW informed her that it was an EEG.  She quickly agreed.  She states the EEG "came back fine."  CSW asked if she feels she she has an understanding of what baby has to accomplish in order to be ready for discharge and she was unsure.  She states she has been told that baby will likely be hospitalized 6-7 weeks.  CSW asked if she would like to talk about milestones in general and she stated interest.  CSW explained to her that CSW was not talking about anything specifically related to her baby, but informed her of general things baby must do before discharge (feeding, maintaining temperature, gaining weight) and what  she might expect throughout this experience.  MOB was attentive and stated understanding.  CSW encourages her to focus on her baby, rather than his surroundings or discharge date.  While we suggest that she keep her due date in mind as far as possible discharge time, CSW explained that some babies go home sooner and some stay longer.  CSW cautioned MOB on setting expectations regarding discharge date as this can lead to letdown.   CSW inquired about MOB's supports.  She states she lives with her mother and her mother's boyfriend.  She has two brothers (16 and 63), both of whom live in [place], but not in the home.  She states her younger brother lives nearby with his  father and they see him regularly.  She states she does not particularly feel close to her older brother.  She reports that she has a very good relationship with her mother and relies on her mother for transportation as she herself does not drive.  She states MGM's boyfriend is "cool," but did not have much to say about him other than "he is new."  She reports that FOB is Jhonnie Garner and that she gave baby his middle name.  She did not report his last name.  She states they have not been in touch in about a month, but that she send him a message that the baby was born.  She has not heard back from him.  She reports that they were friends when they conceived.  She does not seem concerned with FOB's lack of involvement and does not report that he is a source of stress for her. MOB reports that she has started to gather items for baby, but is awaiting her baby shower, planned for 04/07/15 before purchasing any big items.  She states she thinks her mom's friend has gotten her the car seat and bassinet.  CSW informed her of resources through [employer] if she does not have needed items prior to baby's discharge.  CSW asked that she update CSW after her baby shower/closer to discharge on any needs.  She agreed.  CSW provided education regarding SIDS precautions, to which MOB was attentive.  She states she is familiar with this. CSW asked MOB how she is coping with her HIV diagnosis, if she felt comfortable talking about it.  She states she has been doing really well and takes her medication every day.  She reports having follow up at [facility], but reports that work and transportation have been barriers to attending appointments.  CSW informed her of baby's need for follow up, which she is aware of.  CSW informed her of Medicaid transportation and the importance of not only following up for herself, but for baby as well-pediatric and ID clinic.  She states understanding.  CSW provided her with the contact number to  Medicaid transportation.  MOB states desire to move her appointments (ID and OB) to [facility] for convenience.  She states she has already talked to her OB about this.  CSW suggests she speak with the clinic at [facility] also, and informed her that baby cannot be seen in [place] and will have to go to [place], [place] or [place].  She reports that she would like follow up arranged for him at [place] will make this referral closer to discharge.  (CSW received a call yesterday from [facility] Social Worker, stating MOB's noncompliance with appointments.  She is aware that baby has been born, admitted  to NICU, and will be updated by CSW when he is closer to discharge regarding his first follow up appointment).   CSW discussed common emotions often experience in the first few weeks after delivery as well as signs and symptoms of PPD to watch for.  MOB states she is feeling "fine" emotionally now, and reports a history of panic attacks.  She states approximately a year ago she went to the hospital because of Anxiety, but that she has had no other episodes that were concerning to her.  CSW dicussed coping mechanisms and acknowledged that the NICU can be an anxiety-provoking experience.  MOB states she finds meditation helpful when she is feeling anxious.  CSW suggests some other strategies to try and encouraged her to utilize these when she is feeling anxious.  CSW stressed the importance of talking with CSW and or her doctor if she has concerns about her mental health at any time.  She agreed. CSW thanked MOB for sharing with CSW today and explained ongoing support services offered by NICU CSW.  CSW provided contact information.  CSW Plan/Description:  Patient/Family Education , Psychosocial Support and Ongoing Assessment of Needs    Alphonzo Cruise, [place] 02-15-2015, 11:08 AM

## 2015-03-29 NOTE — Discharge Instructions (Signed)
Contraception Choices Birth control (contraception) is the use of any methods or devices to stop pregnancy from happening. Below are some methods to help avoid pregnancy. HORMONAL BIRTH CONTROL  A small tube put under the skin of the upper arm (implant). The tube can stay in place for 3 years. The implant must be taken out after 3 years.  Shots given every 3 months.  Pills taken every day.  Patches that are changed once a week.  A ring put into the vagina (vaginal ring). The ring is left in place for 3 weeks and removed for 1 week. Then, a new ring is put in the vagina.  Emergency birth control pills taken after unprotected sex (intercourse). BARRIER BIRTH CONTROL   A thin covering worn on the penis (female condom) during sex.  A soft, loose covering put into the vagina (female condom) before sex.  A rubber bowl that sits over the cervix (diaphragm). The bowl must be made for you. The bowl is put into the vagina before sex. The bowl is left in place for 6 to 8 hours after sex.  A small, soft cup that fits over the cervix (cervical cap). The cup must be made for you. The cup can be left in place for 48 hours after sex.  A sponge that is put into the vagina before sex.  A chemical that kills or stops sperm from getting into the cervix and uterus (spermicide). The chemical may be a cream, jelly, foam, or pill. INTRAUTERINE (IUD) BIRTH CONTROL   IUD birth control is a small, T-shaped piece of plastic. The plastic is put inside the uterus. There are 2 types of IUD:  Copper IUD. The IUD is covered in copper wire. The copper makes a fluid that kills sperm. It can stay in place for 10 years.  Hormone IUD. The hormone stops pregnancy from happening. It can stay in place for 5 years. PERMANENT METHODS  When the woman has her fallopian tubes sealed, tied, or blocked during surgery. This stops the egg from traveling to the uterus.  The doctor places a small coil or insert into each fallopian  tube. This causes scar tissue to form and blocks the fallopian tubes.  When the female has the tubes that carry sperm tied off (vasectomy). NATURAL FAMILY PLANNING BIRTH CONTROL   Natural family planning means not having sex or using barrier birth control on the days the woman could become pregnant.  Use a calendar to keep track of the length of each period and know the days she can get pregnant.  Avoid sex during ovulation.  Use a thermometer to measure body temperature. Also watch for symptoms of ovulation.  Time sex to be after the woman has ovulated. Use condoms to help protect yourself against sexually transmitted infections (STIs). Do this no matter what type of birth control you use. Talk to your doctor about which type of birth control is best for you.   This information is not intended to replace advice given to you by your health care provider. Make sure you discuss any questions you have with your health care provider.   Document Released: 11/16/2008 Document Revised: 01/24/2013 Document Reviewed: 08/10/2012 Elsevier Interactive Patient Education 2016 Elsevier Inc.  Postpartum Care After Vaginal Delivery After you deliver your newborn (postpartum period), the usual stay in the hospital is 24-72 hours. If there were problems with your labor or delivery, or if you have other medical problems, you might be in the hospital longer.  While you are in the hospital, you will receive help and instructions on how to care for yourself and your newborn during the postpartum period.  While you are in the hospital:  Be sure to tell your nurses if you have pain or discomfort, as well as where you feel the pain and what makes the pain worse.  If you had an incision made near your vagina (episiotomy) or if you had some tearing during delivery, the nurses may put ice packs on your episiotomy or tear. The ice packs may help to reduce the pain and swelling.  If you are breastfeeding, you may feel  uncomfortable contractions of your uterus for a couple of weeks. This is normal. The contractions help your uterus get back to normal size.  It is normal to have some bleeding after delivery.  For the first 1-3 days after delivery, the flow is red and the amount may be similar to a period.  It is common for the flow to start and stop.  In the first few days, you may pass some small clots. Let your nurses know if you begin to pass large clots or your flow increases.  Do not  flush blood clots down the toilet before having the nurse look at them.  During the next 3-10 days after delivery, your flow should become more watery and pink or brown-tinged in color.  Ten to fourteen days after delivery, your flow should be a small amount of yellowish-white discharge.  The amount of your flow will decrease over the first few weeks after delivery. Your flow may stop in 6-8 weeks. Most women have had their flow stop by 12 weeks after delivery.  You should change your sanitary pads frequently.  Wash your hands thoroughly with soap and water for at least 20 seconds after changing pads, using the toilet, or before holding or feeding your newborn.  You should feel like you need to empty your bladder within the first 6-8 hours after delivery.  In case you become weak, lightheaded, or faint, call your nurse before you get out of bed for the first time and before you take a shower for the first time.  Within the first few days after delivery, your breasts may begin to feel tender and full. This is called engorgement. Breast tenderness usually goes away within 48-72 hours after engorgement occurs. You may also notice milk leaking from your breasts. If you are not breastfeeding, do not stimulate your breasts. Breast stimulation can make your breasts produce more milk.  Spending as much time as possible with your newborn is very important. During this time, you and your newborn can feel close and get to know  each other. Having your newborn stay in your room (rooming in) will help to strengthen the bond with your newborn. It will give you time to get to know your newborn and become comfortable caring for your newborn.  Your hormones change after delivery. Sometimes the hormone changes can temporarily cause you to feel sad or tearful. These feelings should not last more than a few days. If these feelings last longer than that, you should talk to your caregiver.  If desired, talk to your caregiver about methods of family planning or contraception.  Talk to your caregiver about immunizations. Your caregiver may want you to have the following immunizations before leaving the hospital:  Tetanus, diphtheria, and pertussis (Tdap) or tetanus and diphtheria (Td) immunization. It is very important that you and your family (including grandparents)  or others caring for your newborn are up-to-date with the Tdap or Td immunizations. The Tdap or Td immunization can help protect your newborn from getting ill.  Rubella immunization.  Varicella (chickenpox) immunization.  Influenza immunization. You should receive this annual immunization if you did not receive the immunization during your pregnancy.   This information is not intended to replace advice given to you by your health care provider. Make sure you discuss any questions you have with your health care provider.   Document Released: 11/16/2006 Document Revised: 10/14/2011 Document Reviewed: 09/16/2011 Elsevier Interactive Patient Education 2016 Elsevier Inc. Postpartum Care After Vaginal Delivery After you deliver your newborn (postpartum period), the usual stay in the hospital is 24-72 hours. If there were problems with your labor or delivery, or if you have other medical problems, you might be in the hospital longer.  While you are in the hospital, you will receive help and instructions on how to care for yourself and your newborn during the postpartum  period.  While you are in the hospital:  Be sure to tell your nurses if you have pain or discomfort, as well as where you feel the pain and what makes the pain worse.  If you had an incision made near your vagina (episiotomy) or if you had some tearing during delivery, the nurses may put ice packs on your episiotomy or tear. The ice packs may help to reduce the pain and swelling.  If you are breastfeeding, you may feel uncomfortable contractions of your uterus for a couple of weeks. This is normal. The contractions help your uterus get back to normal size.  It is normal to have some bleeding after delivery.  For the first 1-3 days after delivery, the flow is red and the amount may be similar to a period.  It is common for the flow to start and stop.  In the first few days, you may pass some small clots. Let your nurses know if you begin to pass large clots or your flow increases.  Do not  flush blood clots down the toilet before having the nurse look at them.  During the next 3-10 days after delivery, your flow should become more watery and pink or brown-tinged in color.  Ten to fourteen days after delivery, your flow should be a small amount of yellowish-white discharge.  The amount of your flow will decrease over the first few weeks after delivery. Your flow may stop in 6-8 weeks. Most women have had their flow stop by 12 weeks after delivery.  You should change your sanitary pads frequently.  Wash your hands thoroughly with soap and water for at least 20 seconds after changing pads, using the toilet, or before holding or feeding your newborn.  You should feel like you need to empty your bladder within the first 6-8 hours after delivery.  In case you become weak, lightheaded, or faint, call your nurse before you get out of bed for the first time and before you take a shower for the first time.  Within the first few days after delivery, your breasts may begin to feel tender and  full. This is called engorgement. Breast tenderness usually goes away within 48-72 hours after engorgement occurs. You may also notice milk leaking from your breasts. If you are not breastfeeding, do not stimulate your breasts. Breast stimulation can make your breasts produce more milk.  Spending as much time as possible with your newborn is very important. During this time, you and  your newborn can feel close and get to know each other. Having your newborn stay in your room (rooming in) will help to strengthen the bond with your newborn. It will give you time to get to know your newborn and become comfortable caring for your newborn.  Your hormones change after delivery. Sometimes the hormone changes can temporarily cause you to feel sad or tearful. These feelings should not last more than a few days. If these feelings last longer than that, you should talk to your caregiver.  If desired, talk to your caregiver about methods of family planning or contraception.  Talk to your caregiver about immunizations. Your caregiver may want you to have the following immunizations before leaving the hospital:  Tetanus, diphtheria, and pertussis (Tdap) or tetanus and diphtheria (Td) immunization. It is very important that you and your family (including grandparents) or others caring for your newborn are up-to-date with the Tdap or Td immunizations. The Tdap or Td immunization can help protect your newborn from getting ill.  Rubella immunization.  Varicella (chickenpox) immunization.  Influenza immunization. You should receive this annual immunization if you did not receive the immunization during your pregnancy.   This information is not intended to replace advice given to you by your health care provider. Make sure you discuss any questions you have with your health care provider.   Document Released: 11/16/2006 Document Revised: 10/14/2011 Document Reviewed: 09/16/2011 Elsevier Interactive Patient  Education [employer]

## 2015-03-29 NOTE — Progress Notes (Signed)
Pt teaching complete  Mom with pt and they will leave  Past visiting nicu

## 2015-03-29 NOTE — Progress Notes (Signed)
Repeat attempt to visit pt in her 3rd floor room and offer support and introduction to spiritual care services today as she was sleeping when I attempted to visit yesterday.  Pt was again sleeping when I visited.  Will continue to follow up and also attempt to connect with patient at her son's bedside.  Please page as further needs arise.  Maryanna Shape. Carley Hammed, M.Div. [facility] Chaplain Pager [phone] Office [phone]     03/29/15 1400  Clinical Encounter Type  Visited With Patient not available  Visit Type Initial  Referral From Chaplain

## 2015-03-30 LAB — TYPE AND SCREEN
ABO/RH(D): A POS
ANTIBODY SCREEN: POSITIVE
DAT, IgG: NEGATIVE
Donor AG Type: NEGATIVE
Donor AG Type: NEGATIVE
PT AG TYPE: NEGATIVE
UNIT DIVISION: 0
Unit division: 0

## 2015-04-05 ENCOUNTER — Ambulatory Visit: Payer: Medicaid Other | Admitting: Infectious Diseases

## 2015-05-08 ENCOUNTER — Ambulatory Visit (INDEPENDENT_AMBULATORY_CARE_PROVIDER_SITE_OTHER): Payer: Medicaid Other | Admitting: Obstetrics and Gynecology

## 2015-05-08 ENCOUNTER — Encounter: Payer: Self-pay | Admitting: Obstetrics and Gynecology

## 2015-05-08 NOTE — Progress Notes (Signed)
  Subjective:     Sherry Robbins is a 20 y.o. female who presents for a postpartum visit. She is 6 weeks postpartum following a spontaneous vaginal delivery. I have fully reviewed the prenatal and intrapartum course. The delivery was at 33 gestational weeks. Outcome: spontaneous vaginal delivery. Anesthesia: epidural. Postpartum course has been uncomplicated. Baby's course has been complicated by a prolonged NICU stay. Baby is feeding by Formula. Bleeding no bleeding. Bowel function is normal. Bladder function is normal. Patient is sexually active. Contraception method is condoms. Postpartum depression screening: negative.     Review of Systems Pertinent items are noted in HPI.   Objective:    BP 111/61 mmHg  Pulse 109  Temp(Src) 98.7 F (37.1 C)  Wt 106 lb 4.8 oz (48.217 kg)  Breastfeeding? No  General:  alert, cooperative and no distress   Breasts:  inspection negative, no nipple discharge or bleeding, no masses or nodularity palpable  Lungs: clear to auscultation bilaterally  Heart:  regular rate and rhythm  Abdomen: soft, non-tender; bowel sounds normal; no masses,  no organomegaly   Vulva:  normal  Vagina: normal vagina, no discharge, exudate, lesion, or erythema  Cervix:  multiparous appearance  Corpus: normal size, contour, position, consistency, mobility, non-tender  Adnexa:  normal adnexa and no mass, fullness, tenderness  Rectal Exam: Not performed.        Assessment:     Normal postpartum exam. Pap smear not done at today's visit.   Plan:    1. Contraception: condoms. May return for Depo-Provera 2. Patient is medically cleared to resume all activities of daily living. Contraception options were reviewed with the patient 3. Patient has been following with ID as scheduled 4. Her son is expected to be discharged soon. Her mother and FOB will be helping care for the infant 5. Follow up in:  A few weeks for contraception or as needed.

## 2015-05-08 NOTE — Patient Instructions (Signed)
Contraception Choices Contraception (birth control) is the use of any methods or devices to prevent pregnancy. Below are some methods to help avoid pregnancy. HORMONAL METHODS   Contraceptive implant. This is a thin, plastic tube containing progesterone hormone. It does not contain estrogen hormone. Your health care provider inserts the tube in the inner part of the upper arm. The tube can remain in place for up to 3 years. After 3 years, the implant must be removed. The implant prevents the ovaries from releasing an egg (ovulation), thickens the cervical mucus to prevent sperm from entering the uterus, and thins the lining of the inside of the uterus.  Progesterone-only injections. These injections are given every 3 months by your health care provider to prevent pregnancy. This synthetic progesterone hormone stops the ovaries from releasing eggs. It also thickens cervical mucus and changes the uterine lining. This makes it harder for sperm to survive in the uterus.  Birth control pills. These pills contain estrogen and progesterone hormone. They work by preventing the ovaries from releasing eggs (ovulation). They also cause the cervical mucus to thicken, preventing the sperm from entering the uterus. Birth control pills are prescribed by a health care provider.Birth control pills can also be used to treat heavy periods.  Minipill. This type of birth control pill contains only the progesterone hormone. They are taken every day of each month and must be prescribed by your health care provider.  Birth control patch. The patch contains hormones similar to those in birth control pills. It must be changed once a week and is prescribed by a health care provider.  Vaginal ring. The ring contains hormones similar to those in birth control pills. It is left in the vagina for 3 weeks, removed for 1 week, and then a new one is put back in place. The patient must be comfortable inserting and removing the ring  from the vagina.A health care provider's prescription is necessary.  Emergency contraception. Emergency contraceptives prevent pregnancy after unprotected sexual intercourse. This pill can be taken right after sex or up to 5 days after unprotected sex. It is most effective the sooner you take the pills after having sexual intercourse. Most emergency contraceptive pills are available without a prescription. Check with your pharmacist. Do not use emergency contraception as your only form of birth control. BARRIER METHODS   Female condom. This is a thin sheath (latex or rubber) that is worn over the penis during sexual intercourse. It can be used with spermicide to increase effectiveness.  Female condom. This is a soft, loose-fitting sheath that is put into the vagina before sexual intercourse.  Diaphragm. This is a soft, latex, dome-shaped barrier that must be fitted by a health care provider. It is inserted into the vagina, along with a spermicidal jelly. It is inserted before intercourse. The diaphragm should be left in the vagina for 6 to 8 hours after intercourse.  Cervical cap. This is a round, soft, latex or plastic cup that fits over the cervix and must be fitted by a health care provider. The cap can be left in place for up to 48 hours after intercourse.  Sponge. This is a soft, circular piece of polyurethane foam. The sponge has spermicide in it. It is inserted into the vagina after wetting it and before sexual intercourse.  Spermicides. These are chemicals that kill or block sperm from entering the cervix and uterus. They come in the form of creams, jellies, suppositories, foam, or tablets. They do not require a   prescription. They are inserted into the vagina with an applicator before having sexual intercourse. The process must be repeated every time you have sexual intercourse. INTRAUTERINE CONTRACEPTION  Intrauterine device (IUD). This is a T-shaped device that is put in a woman's uterus  during a menstrual period to prevent pregnancy. There are 2 types:  Copper IUD. This type of IUD is wrapped in copper wire and is placed inside the uterus. Copper makes the uterus and fallopian tubes produce a fluid that kills sperm. It can stay in place for 10 years.  Hormone IUD. This type of IUD contains the hormone progestin (synthetic progesterone). The hormone thickens the cervical mucus and prevents sperm from entering the uterus, and it also thins the uterine lining to prevent implantation of a fertilized egg. The hormone can weaken or kill the sperm that get into the uterus. It can stay in place for 3-5 years, depending on which type of IUD is used. PERMANENT METHODS OF CONTRACEPTION  Female tubal ligation. This is when the woman's fallopian tubes are surgically sealed, tied, or blocked to prevent the egg from traveling to the uterus.  Hysteroscopic sterilization. This involves placing a small coil or insert into each fallopian tube. Your doctor uses a technique called hysteroscopy to do the procedure. The device causes scar tissue to form. This results in permanent blockage of the fallopian tubes, so the sperm cannot fertilize the egg. It takes about 3 months after the procedure for the tubes to become blocked. You must use another form of birth control for these 3 months.  Female sterilization. This is when the female has the tubes that carry sperm tied off (vasectomy).This blocks sperm from entering the vagina during sexual intercourse. After the procedure, the man can still ejaculate fluid (semen). NATURAL PLANNING METHODS  Natural family planning. This is not having sexual intercourse or using a barrier method (condom, diaphragm, cervical cap) on days the woman could become pregnant.  Calendar method. This is keeping track of the length of each menstrual cycle and identifying when you are fertile.  Ovulation method. This is avoiding sexual intercourse during ovulation.  Symptothermal  method. This is avoiding sexual intercourse during ovulation, using a thermometer and ovulation symptoms.  Post-ovulation method. This is timing sexual intercourse after you have ovulated. Regardless of which type or method of contraception you choose, it is important that you use condoms to protect against the transmission of sexually transmitted infections (STIs). Talk with your health care provider about which form of contraception is most appropriate for you.   This information is not intended to replace advice given to you by your health care provider. Make sure you discuss any questions you have with your health care provider.   Document Released: 01/19/2005 Document Revised: 01/24/2013 Document Reviewed: 07/14/2012 Elsevier Interactive Patient Education 2016 Elsevier Inc.  

## 2015-05-08 NOTE — Progress Notes (Deleted)
Patient ID: Sherry Robbins, female   DOB: Nov 26, 1995, 20 y.o.   MRN: [id number] Subjective:     Sherry Robbins is a 20 y.o. female who presents for a postpartum visit. She is {1-10:13787} {time; units:18646} postpartum following a {delivery:12449}. I have fully reviewed the prenatal and intrapartum course. The delivery was at *** gestational weeks. Outcome: {delivery outcome:32078}. Anesthesia: {anesthesia types:812}. Postpartum course has been ***. Baby's course has been ***. Baby is feeding by {breast/bottle:69}. Bleeding {vag bleed:12292}. Bowel function is {normal:32111}. Bladder function is {normal:32111}. Patient {is/is not:9024} sexually active. Contraception method is {contraceptive method:5051}. Postpartum depression screening: {neg default:13464::"negative"}.  {Common ambulatory SmartLinks:19316}  Review of Systems {ros; complete:30496}   Objective:    BP 111/61 mmHg  Pulse 109  Temp(Src) 98.7 F (37.1 C)  Wt 106 lb 4.8 oz (48.217 kg)  Breastfeeding? No  General:  {gen appearance:16600}   Breasts:  {breast exam:1202::"inspection negative, no nipple discharge or bleeding, no masses or nodularity palpable"}  Lungs: {lung exam:16931}  Heart:  {heart exam:5510}  Abdomen: {abdomen exam:16834}   Vulva:  {labia exam:12198}  Vagina: {vagina exam:12200}  Cervix:  {cervix exam:14595}  Corpus: {uterus exam:12215}  Adnexa:  {adnexa exam:12223}  Rectal Exam: {rectal/vaginal exam:12274}        Assessment:    *** postpartum exam. Pap smear {done:10129} at today's visit.   Plan:    1. Contraception: {method:5051} 2. *** 3. Follow up in: {1-10:13787} {time; units:19136} or as needed.

## 2015-06-26 ENCOUNTER — Telehealth: Payer: Self-pay

## 2015-06-26 NOTE — Telephone Encounter (Signed)
Patient contacted regarding new intake appointment. Date and time given. Information given regarding documents needed to qualify for financial eligibility.  Tammy K King, RN  

## 2015-07-11 ENCOUNTER — Ambulatory Visit: Payer: Medicaid Other

## 2015-12-13 ENCOUNTER — Telehealth: Payer: Self-pay

## 2015-12-13 NOTE — Telephone Encounter (Signed)
[facility] and [name] is high risk for non compliance with pregnancies.  She will need to be followed closely with ID and [facility] .  Dr Hyacinth MeekerMiller started her on Descovy and Edurant on 12-11-15. Labs also completed same day.   Previous pregnancy child was born with medical issues related to patient's inconsistency with care.

## 2015-12-13 NOTE — Telephone Encounter (Signed)
New 6 month pregnant patient without care for HIV. She has been referred to [facility] but has issues with transportation.  They would like this pateint [place]. [facility].  I contacted patient and gave her information regarding a work in appointment with Dr Luciana Axeomer.   Left message with Irving Burtonmily.   Laurell Josephsammy K Khylee Algeo, RN

## 2015-12-18 IMAGING — CT CT BIOPSY
4 of 6 series · 8 of 22 positions shown, 11 images · non-contrast
Comparison: none

CLINICAL DATA: Pancytopenia

[Series 4: add scan 5.0 b70f · axial · 0.60mm/px · z∈[-42,-36]mm · 2 of 4 slices shown, 5 images (1 of 4)]
[im 2/4  soft-tissue]
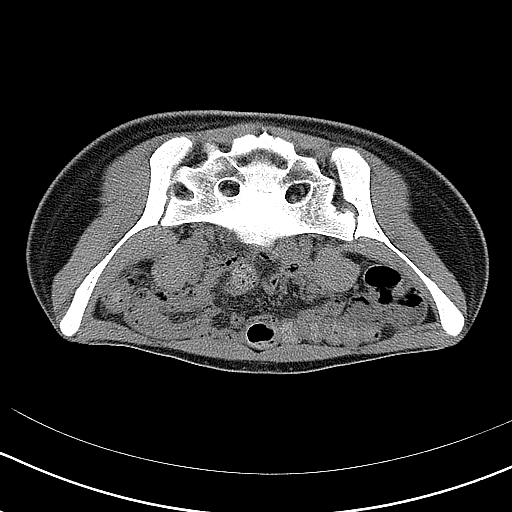
[im 2/4  lung]
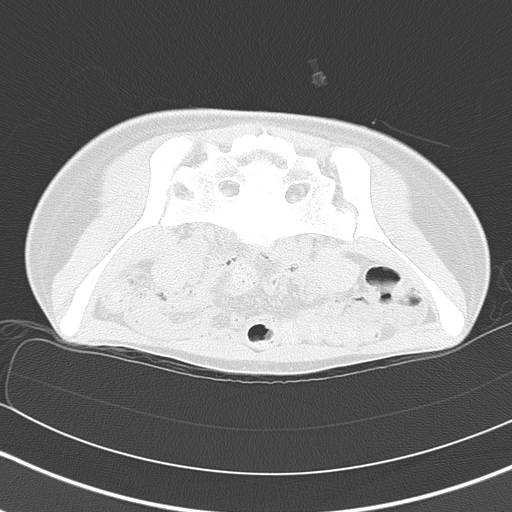
[im 2/4  bone]
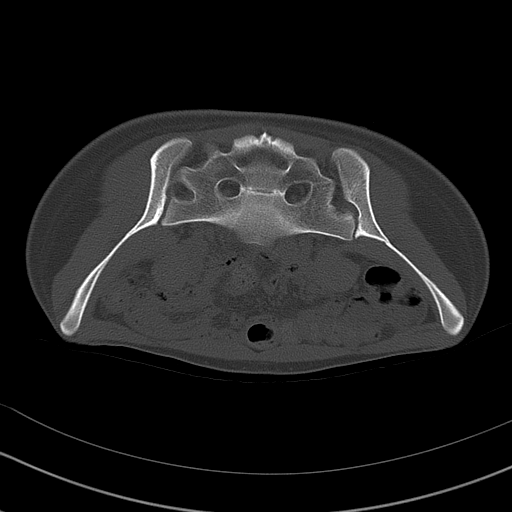
[im 3/4  soft-tissue]
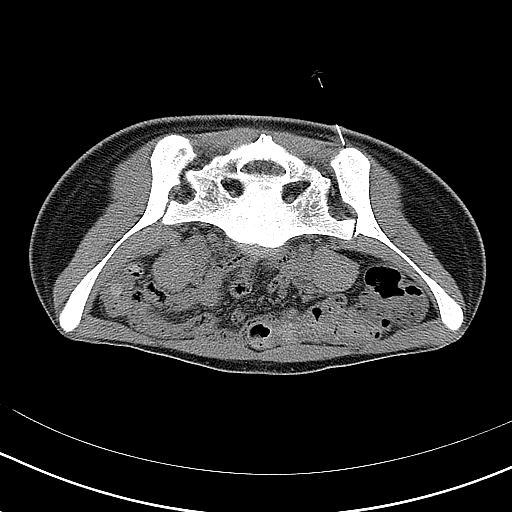
[im 3/4  lung]
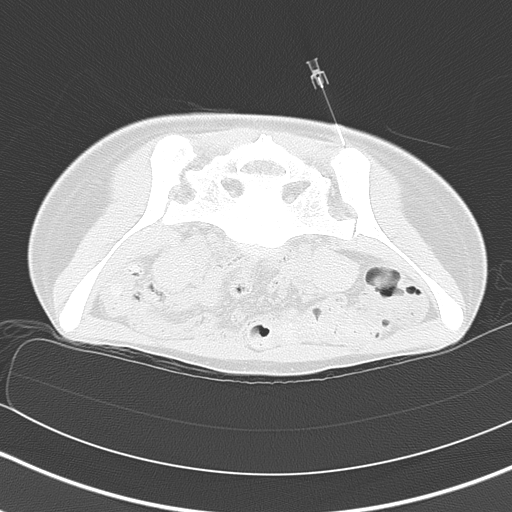

[Series 5: add scan 5.0 b70f · axial · 0.60mm/px · z∈[-42,-36]mm · 2 of 4 slices shown (2 of 4)]
[im 2/4  soft-tissue]
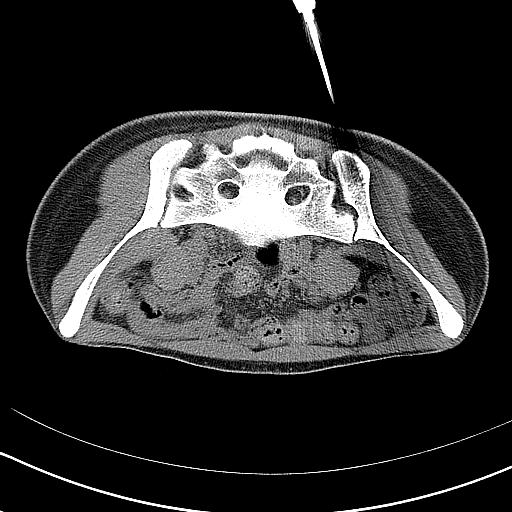
[im 3/4  soft-tissue]
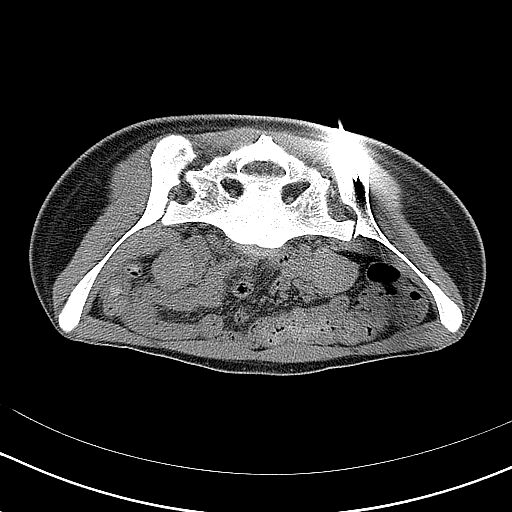

[Series 6: add scan 5.0 b70f · axial · 0.60mm/px · z∈[-42,-36]mm · 2 of 4 slices shown (3 of 4)]
[im 2/4  soft-tissue]
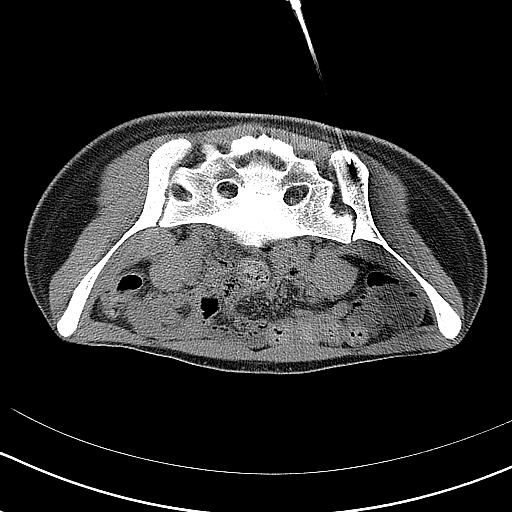
[im 3/4  soft-tissue]
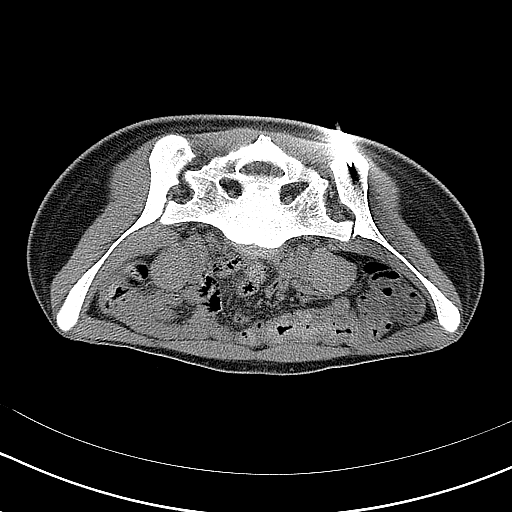

[Series 7: add scan 5.0 b70f · axial · 0.60mm/px · z∈[-42,-36]mm · 2 of 4 slices shown (4 of 4)]
[im 2/4  soft-tissue]
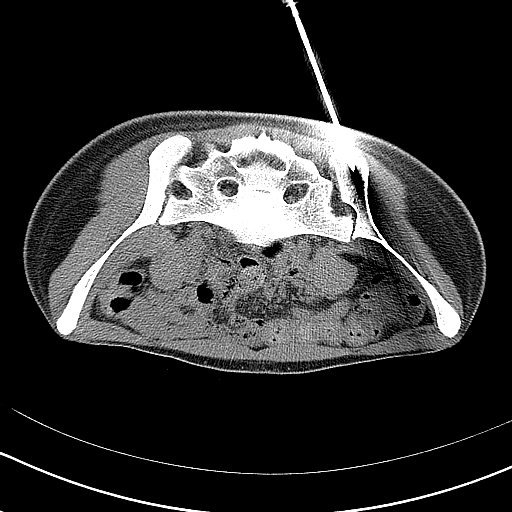
[im 3/4  soft-tissue]
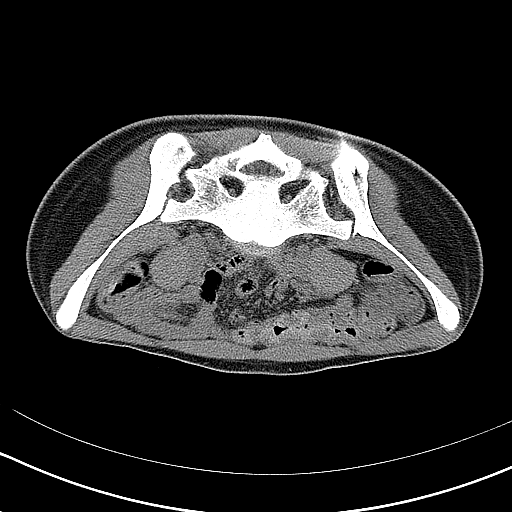

[8 of 22 positions shown; findings below may reference images not displayed]

EXAM:
CT-GUIDED BONE MARROW ASPIRATE AND CORE.

MEDICATIONS AND MEDICAL HISTORY:
Versed 2 mg, Fentanyl 75 mcg.

Additional Medications: None.

ANESTHESIA/SEDATION:
Moderate sedation time: 12 minutes

PROCEDURE:
The procedure, risks, benefits, and alternatives were explained to
the patient. Questions regarding the procedure were encouraged and
answered. The patient understands and consents to the procedure.

The right neck was prepped with Betadine in a sterile fashion, and a
sterile drape was applied covering the operative field. A sterile
gown and sterile gloves were used for the procedure.

Under CT guidance, an 11 gauge needle was inserted into the right
iliac bone. Aspirates and core is were obtained. A separate core was
placed in saline for culture. Final imaging was performed.

Patient tolerated the procedure well without complication. Vital
sign monitoring by nursing staff during the procedure will continue
as patient is in the special procedures unit for post procedure
observation.
FINDINGS: The images document guide needle placement within the right iliac
bone. Post biopsy images demonstrate no hemorrhage.

COMPLICATIONS:
None
IMPRESSION: Successful CT-guided bone marrow aspirate and core.

## 2015-12-21 ENCOUNTER — Inpatient Hospital Stay (HOSPITAL_COMMUNITY): Payer: Medicaid Other

## 2015-12-21 ENCOUNTER — Encounter (HOSPITAL_COMMUNITY): Payer: Self-pay | Admitting: Certified Nurse Midwife

## 2015-12-21 ENCOUNTER — Inpatient Hospital Stay (HOSPITAL_COMMUNITY)
Admission: AD | Admit: 2015-12-21 | Discharge: 2015-12-21 | Disposition: A | Discharge status: Home / Self Care | Payer: Medicaid Other | Source: Ambulatory Visit | Attending: Obstetrics & Gynecology | Admitting: Obstetrics & Gynecology

## 2015-12-21 DIAGNOSIS — Z3687 Encounter for antenatal screening for uncertain dates: Secondary | ICD-10-CM | POA: Insufficient documentation

## 2015-12-21 DIAGNOSIS — Z21 Asymptomatic human immunodeficiency virus [HIV] infection status: Secondary | ICD-10-CM | POA: Insufficient documentation

## 2015-12-21 DIAGNOSIS — O98713 Human immunodeficiency virus [HIV] disease complicating pregnancy, third trimester: Secondary | ICD-10-CM | POA: Diagnosis not present

## 2015-12-21 DIAGNOSIS — R109 Unspecified abdominal pain: Secondary | ICD-10-CM | POA: Insufficient documentation

## 2015-12-21 DIAGNOSIS — O98719 Human immunodeficiency virus [HIV] disease complicating pregnancy, unspecified trimester: Secondary | ICD-10-CM

## 2015-12-21 DIAGNOSIS — R52 Pain, unspecified: Secondary | ICD-10-CM

## 2015-12-21 DIAGNOSIS — O26893 Other specified pregnancy related conditions, third trimester: Secondary | ICD-10-CM | POA: Insufficient documentation

## 2015-12-21 DIAGNOSIS — O09213 Supervision of pregnancy with history of pre-term labor, third trimester: Secondary | ICD-10-CM | POA: Diagnosis not present

## 2015-12-21 DIAGNOSIS — Z87891 Personal history of nicotine dependence: Secondary | ICD-10-CM | POA: Diagnosis not present

## 2015-12-21 DIAGNOSIS — Z3A33 33 weeks gestation of pregnancy: Secondary | ICD-10-CM | POA: Diagnosis not present

## 2015-12-21 DIAGNOSIS — M5431 Sciatica, right side: Secondary | ICD-10-CM

## 2015-12-21 LAB — DIFFERENTIAL
BASOS ABS: 0 10*3/uL (ref 0.0–0.1)
BASOS PCT: 0 %
EOS ABS: 0 10*3/uL (ref 0.0–0.7)
Eosinophils Relative: 0 %
Lymphocytes Relative: 17 %
Lymphs Abs: 1.5 10*3/uL (ref 0.7–4.0)
Monocytes Absolute: 0.4 10*3/uL (ref 0.1–1.0)
Monocytes Relative: 4 %
NEUTROS ABS: 7.1 10*3/uL (ref 1.7–7.7)
NEUTROS PCT: 79 %

## 2015-12-21 LAB — URINE MICROSCOPIC-ADD ON: RBC / HPF: NONE SEEN RBC/hpf (ref 0–5)

## 2015-12-21 LAB — TYPE AND SCREEN
ABO/RH(D): A POS
ANTIBODY SCREEN: POSITIVE
DAT, IGG: NEGATIVE

## 2015-12-21 LAB — URINALYSIS, ROUTINE W REFLEX MICROSCOPIC
Bilirubin Urine: NEGATIVE
GLUCOSE, UA: NEGATIVE mg/dL
Hgb urine dipstick: NEGATIVE
KETONES UR: NEGATIVE mg/dL
Nitrite: NEGATIVE
PROTEIN: NEGATIVE mg/dL
Specific Gravity, Urine: 1.015 (ref 1.005–1.030)
pH: 6.5 (ref 5.0–8.0)

## 2015-12-21 LAB — CBC
HCT: 24.2 % — ABNORMAL LOW (ref 36.0–46.0)
Hemoglobin: 7.6 g/dL — ABNORMAL LOW (ref 12.0–15.0)
MCH: 20.6 pg — ABNORMAL LOW (ref 26.0–34.0)
MCHC: 31.4 g/dL (ref 30.0–36.0)
MCV: 65.6 fL — ABNORMAL LOW (ref 78.0–100.0)
Platelets: 263 10*3/uL (ref 150–400)
RBC: 3.69 MIL/uL — ABNORMAL LOW (ref 3.87–5.11)
RDW: 18.5 % — AB (ref 11.5–15.5)
WBC: 9 10*3/uL (ref 4.0–10.5)

## 2015-12-21 LAB — RAPID URINE DRUG SCREEN, HOSP PERFORMED
AMPHETAMINES: NOT DETECTED
BENZODIAZEPINES: NOT DETECTED
Barbiturates: NOT DETECTED
Cocaine: NOT DETECTED
OPIATES: NOT DETECTED
Tetrahydrocannabinol: NOT DETECTED

## 2015-12-21 NOTE — MAU Note (Signed)
Patient presents with no PNC, had baby in February (1 month early), having pain in left hip area, patient is intermittent, no vaginal bleeding, no vaginal discharge.

## 2015-12-21 NOTE — MAU Provider Note (Signed)
History     CSN: [id number]  Arrival date and time: 12/21/15 1404   None     Chief Complaint  Patient presents with  . Abdominal Pain   Ms. Gosch is a 20 year old female [id number] with a history of HIV who presents to the office with right-sided buttock pain. She describes the pain as a sharp, non-radiating pain that improves with sitting. It seems to only occur while she is on her feet for long time. She has not tried taking any medications. She is not tried using a pregnancy support belt. She is not having pain now. 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 She reports not drinking enough water throughout the day. She had a positive home pregnancy test in March, but has not received any prenatal care for her current pregnancy. She reports that she last had sexual intercourse months ago. Denies fevers, chills, nausea, vomiting, abdominal pain, vaginal bleeding, vaginal discharge, dysuria.   Patient does have a history of a suture. [facility]. Her last viral load was performed on November 8 and was undetectable.    OB History    Gravida Para Term Preterm AB Living   2 1   1   1    SAB TAB Ectopic Multiple Live Births         0 1      Past Medical History:  Diagnosis Date  . HIV disease (HCC) 06/2014  . Medical history non-contributory   . Tachycardia 10/2014    Past Surgical History:  Procedure Laterality Date  . NO PAST SURGERIES      Family History  Problem Relation Age of Onset  . Cancer Maternal Grandmother     Social History  Substance Use Topics  . Smoking status: Former Smoker    Types: Cigars    Quit date: 08/30/2014  . Smokeless tobacco: Never Used  . Alcohol use No    Allergies:  Allergies  Allergen Reactions  . Ceftriaxone Other (See Comments)    Reaction:  Fever and cytopenias  . Cephalexin Other (See Comments)    Reaction:  Fever and cytopenias  . Zithromax  [Azithromycin] Other (See Comments)    Reaction:  Fever and cytopenias    Prescriptions Prior to Admission  Medication Sig Dispense Refill Last Dose  . emtricitabine-tenofovir AF (DESCOVY) 200-25 MG tablet Take 1 tablet by mouth daily with supper.    Taking  . ferrous sulfate 325 (65 FE) MG tablet Take 1 tablet (325 mg total) by mouth 2 (two) times daily with a meal. (Patient not taking: Reported on 05/08/2015) 100 tablet 3 Not Taking  . rilpivirine (EDURANT) 25 MG TABS tablet Take 25 mg by mouth daily with supper.   Taking    Review of Systems  Constitutional: Negative for chills and fever.  HENT: Negative for congestion and sore throat.   Eyes: Negative for blurred vision and double vision.  Respiratory: Negative for cough and shortness of breath.   Cardiovascular: Negative for chest pain, claudication and leg swelling.  Gastrointestinal: Negative for abdominal pain, heartburn, nausea and vomiting.  Genitourinary: Negative for dysuria, frequency and urgency.  Skin: Negative for rash.   Physical Exam   Blood pressure 115/61, pulse 100, temperature 98.2 F (36.8 C), temperature source Oral, resp. rate 16, height 5\' 4"  (1.626 m), weight 57.2 kg (126 lb), not currently breastfeeding.  Physical Exam  Constitutional: She  is oriented to person, place, and time. She appears well-developed and well-nourished. No distress.  GI: Soft. Bowel sounds are normal. There is no tenderness.  Musculoskeletal: Normal range of motion. She exhibits no edema.  Neurological: She is alert and oriented to person, place, and time. She displays normal reflexes. No cranial nerve deficit. Coordination normal.  Skin: Skin is warm and dry. She is not diaphoretic.  Psychiatric: Her behavior is normal.    MAU Course  Procedures  MDM  In MAU we will perform prenatal labs and assess for gestational age with ultrasound. Patient's right-sided buttock pain is likely sciatic, or musculoskeletal in nature advised  pregnancy support belt. UA UDS CBC Type and Screen Hep B surface antigen Rubella screen RPR Complete U/S   Assessment and Plan  Ms. Kugler is a 20 year old female 252P0101 with a history of HIV who presents to the office with right-sided buttock pain. The pain is likely musculoskeletal in nature, as her job requires her to stand on her feet for most of the day. She is also dehydrated. We instructed her to take Tylenol as needed for pain, and to stay hydrated throughout the day. We ordered prenatal screening labs and scheduled her for a prenatal appointment in the future.  She was instructed to return if she has worsening of her symptoms. Message was sent to the office to get patient an appointment of either side of her women's health care at [facility] or [facility] .  Ernestina PennaNicholas Schenk M.D. 12/21/2015, 3:10 PM

## 2015-12-21 NOTE — Discharge Instructions (Signed)
Back Pain in Pregnancy Introduction Back pain during pregnancy is common. Back pain may be caused by several factors that are related to changes during your pregnancy. Follow these instructions at home: Managing pain, stiffness, and swelling  If directed, apply ice for sudden (acute) back pain.  Put ice in a plastic bag.  Place a towel between your skin and the bag.  Leave the ice on for 20 minutes, 2-3 times per day.  If directed, apply heat to the affected area before you exercise:  Place a towel between your skin and the heat pack or heating pad.  Leave the heat on for 20-30 minutes.  Remove the heat if your skin turns bright red. This is especially important if you are unable to feel pain, heat, or cold. You may have a greater risk of getting burned. Activity  Exercise as told by your health care provider. Exercising is the best way to prevent or manage back pain.  Listen to your body when lifting. If lifting hurts, ask for help or bend your knees. This uses your leg muscles instead of your back muscles.  Squat down when picking up something from the floor. Do not bend over.  Only use bed rest as told by your health care provider. Bed rest should only be used for the most severe episodes of back pain. Standing, Sitting, and Lying Down  Do not stand in one place for long periods of time.  Use good posture when sitting. Make sure your head rests over your shoulders and is not hanging forward. Use a pillow on your lower back if necessary.  Try sleeping on your side, preferably the left side, with a pillow or two between your legs. If you are sore after a night's rest, your bed may be too soft. A firm mattress may provide more support for your back during pregnancy. General instructions  Do not wear high heels.  Eat a healthy diet. Try to gain weight within your health care provider's recommendations.  Use a maternity girdle, elastic sling, or back brace as told by your  health care provider.  Take over-the-counter and prescription medicines only as told by your health care provider.  Keep all follow-up visits as told by your health care provider. This is important. This includes any visits with any specialists, such as a physical therapist. Contact a health care provider if:  Your back pain interferes with your daily activities.  You have increasing pain in other parts of your body. Get help right away if:  You develop numbness, tingling, weakness, or problems with the use of your arms or legs.  You develop severe back pain that is not controlled with medicine.  You have a sudden change in bowel or bladder control.  You develop shortness of breath, dizziness, or you faint.  You develop nausea, vomiting, or sweating.  You have back pain that is a rhythmic, cramping pain similar to labor pains. Labor pain is usually 1-2 minutes apart, lasts for about 1 minute, and involves a bearing down feeling or pressure in your pelvis.  You have back pain and your water breaks or you have vaginal bleeding.  You have back pain or numbness that travels down your leg.  Your back pain developed after you fell.  You develop pain on one side of your back.  You see blood in your urine.  You develop skin blisters in the area of your back pain. This information is not intended to replace advice given to   you by your health care provider. Make sure you discuss any questions you have with your health care provider. Document Released: 04/29/2005 Document Revised: 06/27/2015 Document Reviewed: 10/03/2014  2017 Elsevier  

## 2015-12-22 LAB — RPR, QUANT+TP ABS (REFLEX)
Rapid Plasma Reagin, Quant: 1:4 {titer} — ABNORMAL HIGH
TREPONEMA PALLIDUM AB: POSITIVE — AB

## 2015-12-22 LAB — RUBELLA SCREEN: Rubella: 11.5 {index}

## 2015-12-22 LAB — RPR: RPR: REACTIVE — AB

## 2015-12-22 LAB — HEPATITIS B SURFACE ANTIGEN: Hepatitis B Surface Ag: NEGATIVE

## 2015-12-23 ENCOUNTER — Ambulatory Visit: Payer: Medicaid Other | Admitting: Internal Medicine

## 2015-12-23 DIAGNOSIS — Z3687 Encounter for antenatal screening for uncertain dates: Secondary | ICD-10-CM

## 2015-12-24 ENCOUNTER — Telehealth: Payer: Self-pay

## 2015-12-24 NOTE — Telephone Encounter (Signed)
Patient Navigator [place] with [facility] calling to see if [name] kept her appointment with Dr Luciana Axeomer for 12-24-15.   Patient did not show for appointment,  We will assign Bridge Counselor to her case as soon as possible .    Irving Burtonmily , Patient Navigator [phone]   Laurell Josephsammy K Numair Masden, RN  [facility].   Laurell Josephsammy K Brooklyn Jeff, RN

## 2016-01-04 ENCOUNTER — Inpatient Hospital Stay (HOSPITAL_COMMUNITY): Payer: Medicaid Other | Admitting: Anesthesiology

## 2016-01-04 ENCOUNTER — Inpatient Hospital Stay (HOSPITAL_COMMUNITY)
Admission: AD | Admit: 2016-01-04 | Discharge: 2016-01-06 | DRG: 774 | Disposition: A | Discharge status: Home / Self Care | Payer: Medicaid Other | Source: Ambulatory Visit | Attending: Family Medicine | Admitting: Family Medicine

## 2016-01-04 ENCOUNTER — Encounter (HOSPITAL_COMMUNITY): Payer: Self-pay

## 2016-01-04 DIAGNOSIS — Z881 Allergy status to other antibiotic agents status: Secondary | ICD-10-CM

## 2016-01-04 DIAGNOSIS — O9872 Human immunodeficiency virus [HIV] disease complicating childbirth: Secondary | ICD-10-CM | POA: Diagnosis present

## 2016-01-04 DIAGNOSIS — O42013 Preterm premature rupture of membranes, onset of labor within 24 hours of rupture, third trimester: Secondary | ICD-10-CM | POA: Diagnosis present

## 2016-01-04 DIAGNOSIS — K219 Gastro-esophageal reflux disease without esophagitis: Secondary | ICD-10-CM | POA: Diagnosis present

## 2016-01-04 DIAGNOSIS — Z3493 Encounter for supervision of normal pregnancy, unspecified, third trimester: Secondary | ICD-10-CM | POA: Diagnosis present

## 2016-01-04 DIAGNOSIS — O9962 Diseases of the digestive system complicating childbirth: Secondary | ICD-10-CM | POA: Diagnosis present

## 2016-01-04 DIAGNOSIS — Z87891 Personal history of nicotine dependence: Secondary | ICD-10-CM

## 2016-01-04 DIAGNOSIS — Z21 Asymptomatic human immunodeficiency virus [HIV] infection status: Secondary | ICD-10-CM | POA: Diagnosis present

## 2016-01-04 DIAGNOSIS — O98713 Human immunodeficiency virus [HIV] disease complicating pregnancy, third trimester: Secondary | ICD-10-CM | POA: Diagnosis not present

## 2016-01-04 DIAGNOSIS — O9902 Anemia complicating childbirth: Secondary | ICD-10-CM | POA: Diagnosis present

## 2016-01-04 DIAGNOSIS — Z3A35 35 weeks gestation of pregnancy: Secondary | ICD-10-CM

## 2016-01-04 DIAGNOSIS — O42919 Preterm premature rupture of membranes, unspecified as to length of time between rupture and onset of labor, unspecified trimester: Secondary | ICD-10-CM | POA: Diagnosis present

## 2016-01-04 DIAGNOSIS — O42913 Preterm premature rupture of membranes, unspecified as to length of time between rupture and onset of labor, third trimester: Secondary | ICD-10-CM

## 2016-01-04 DIAGNOSIS — D649 Anemia, unspecified: Secondary | ICD-10-CM | POA: Diagnosis present

## 2016-01-04 DIAGNOSIS — O98113 Syphilis complicating pregnancy, third trimester: Secondary | ICD-10-CM | POA: Diagnosis not present

## 2016-01-04 LAB — URINALYSIS, ROUTINE W REFLEX MICROSCOPIC
BILIRUBIN URINE: NEGATIVE
Glucose, UA: NEGATIVE mg/dL
HGB URINE DIPSTICK: NEGATIVE
KETONES UR: NEGATIVE mg/dL
Nitrite: POSITIVE — AB
Protein, ur: NEGATIVE mg/dL
SPECIFIC GRAVITY, URINE: 1.015 (ref 1.005–1.030)
pH: 7.5 (ref 5.0–8.0)

## 2016-01-04 LAB — CBC
HCT: 26.4 % — ABNORMAL LOW (ref 36.0–46.0)
Hemoglobin: 7.9 g/dL — ABNORMAL LOW (ref 12.0–15.0)
MCH: 19.3 pg — ABNORMAL LOW (ref 26.0–34.0)
MCHC: 29.9 g/dL — ABNORMAL LOW (ref 30.0–36.0)
MCV: 64.5 fL — ABNORMAL LOW (ref 78.0–100.0)
Platelets: 279 K/uL (ref 150–400)
RBC: 4.09 MIL/uL (ref 3.87–5.11)
RDW: 19 % — ABNORMAL HIGH (ref 11.5–15.5)
WBC: 9.8 K/uL (ref 4.0–10.5)

## 2016-01-04 LAB — URINE MICROSCOPIC-ADD ON: RBC / HPF: NONE SEEN RBC/hpf (ref 0–5)

## 2016-01-04 LAB — WET PREP, GENITAL
SPERM: NONE SEEN
Trich, Wet Prep: NONE SEEN

## 2016-01-04 LAB — RAPID URINE DRUG SCREEN, HOSP PERFORMED
AMPHETAMINES: NOT DETECTED
BARBITURATES: NOT DETECTED
BENZODIAZEPINES: NOT DETECTED
COCAINE: NOT DETECTED
Opiates: NOT DETECTED
Tetrahydrocannabinol: NOT DETECTED

## 2016-01-04 LAB — AMNISURE RUPTURE OF MEMBRANE (ROM) NOT AT ARMC: AMNISURE: POSITIVE

## 2016-01-04 MED ORDER — ACETAMINOPHEN 325 MG PO TABS: 650.0000 mg | ORAL_TABLET | ORAL | Status: DC | PRN

## 2016-01-04 MED ORDER — PHENYLEPHRINE 40 MCG/ML (10ML) SYRINGE FOR IV PUSH (FOR BLOOD PRESSURE SUPPORT): 80.0000 ug | PREFILLED_SYRINGE | INTRAVENOUS | Status: DC | PRN

## 2016-01-04 MED ORDER — LIDOCAINE HCL (PF) 1 % IJ SOLN
30.0000 mL | INTRAMUSCULAR | Status: DC | PRN
  Filled 2016-01-04: qty 30

## 2016-01-04 MED ORDER — OXYTOCIN 40 UNITS IN LACTATED RINGERS INFUSION - SIMPLE MED
1.0000 m[IU]/min | INTRAVENOUS | Status: DC
  Administered 2016-01-04: 2 m[IU]/min via INTRAVENOUS
  Administered 2016-01-04: 666 m[IU]/min via INTRAVENOUS

## 2016-01-04 MED ORDER — LIDOCAINE HCL (PF) 1 % IJ SOLN
INTRAMUSCULAR | Status: DC | PRN
  Administered 2016-01-04 (×2): 6 mL via EPIDURAL

## 2016-01-04 MED ORDER — LACTATED RINGERS IV SOLN
INTRAVENOUS | Status: DC
Start: 2016-01-04 — End: 2016-01-04
  Administered 2016-01-04: 16:00:00 via INTRAVENOUS

## 2016-01-04 MED ORDER — BENZOCAINE-MENTHOL 20-0.5 % EX AERO: 1.0000 "application " | INHALATION_SPRAY | CUTANEOUS | Status: DC | PRN

## 2016-01-04 MED ORDER — SENNOSIDES-DOCUSATE SODIUM 8.6-50 MG PO TABS
2.0000 | ORAL_TABLET | ORAL | Status: DC
  Administered 2016-01-05: 2 via ORAL
  Filled 2016-01-04: qty 2

## 2016-01-04 MED ORDER — OXYCODONE-ACETAMINOPHEN 5-325 MG PO TABS: 2.0000 | ORAL_TABLET | ORAL | Status: DC | PRN

## 2016-01-04 MED ORDER — DIPHENHYDRAMINE HCL 25 MG PO CAPS: 25.0000 mg | ORAL_CAPSULE | Freq: Four times a day (QID) | ORAL | Status: DC | PRN

## 2016-01-04 MED ORDER — PHENYLEPHRINE 40 MCG/ML (10ML) SYRINGE FOR IV PUSH (FOR BLOOD PRESSURE SUPPORT)
80.0000 ug | PREFILLED_SYRINGE | INTRAVENOUS | Status: DC | PRN
  Filled 2016-01-04: qty 10

## 2016-01-04 MED ORDER — OXYTOCIN BOLUS FROM INFUSION: 500.0000 mL | Freq: Once | INTRAVENOUS | Status: DC

## 2016-01-04 MED ORDER — IBUPROFEN 600 MG PO TABS
600.0000 mg | ORAL_TABLET | Freq: Four times a day (QID) | ORAL | Status: DC
  Administered 2016-01-05 – 2016-01-06 (×6): 600 mg via ORAL
  Filled 2016-01-04 (×6): qty 1

## 2016-01-04 MED ORDER — PENICILLIN G POT IN DEXTROSE 60000 UNIT/ML IV SOLN
3.0000 10*6.[IU] | INTRAVENOUS | Status: DC
  Administered 2016-01-04: 3 10*6.[IU] via INTRAVENOUS
  Filled 2016-01-04 (×2): qty 50

## 2016-01-04 MED ORDER — DIPHENHYDRAMINE HCL 50 MG/ML IJ SOLN: 12.5000 mg | INTRAMUSCULAR | Status: DC | PRN

## 2016-01-04 MED ORDER — SIMETHICONE 80 MG PO CHEW: 80.0000 mg | CHEWABLE_TABLET | ORAL | Status: DC | PRN

## 2016-01-04 MED ORDER — PENICILLIN G POTASSIUM 5000000 UNITS IJ SOLR
5.0000 10*6.[IU] | Freq: Once | INTRAVENOUS | Status: AC
  Administered 2016-01-04: 5 10*6.[IU] via INTRAVENOUS
  Filled 2016-01-04: qty 5

## 2016-01-04 MED ORDER — SOD CITRATE-CITRIC ACID 500-334 MG/5ML PO SOLN
30.0000 mL | ORAL | Status: DC | PRN
Start: 2016-01-04 — End: 2016-01-04

## 2016-01-04 MED ORDER — BETAMETHASONE SOD PHOS & ACET 6 (3-3) MG/ML IJ SUSP: 12.0000 mg | Freq: Once | INTRAMUSCULAR | Status: DC

## 2016-01-04 MED ORDER — ONDANSETRON HCL 4 MG/2ML IJ SOLN: 4.0000 mg | Freq: Four times a day (QID) | INTRAMUSCULAR | Status: DC | PRN

## 2016-01-04 MED ORDER — EMTRICITABINE-TENOFOVIR AF 200-25 MG PO TABS
1.0000 | ORAL_TABLET | Freq: Every day | ORAL | Status: DC
  Administered 2016-01-05: 1 via ORAL
  Filled 2016-01-04 (×2): qty 1

## 2016-01-04 MED ORDER — DIBUCAINE 1 % RE OINT: 1.0000 "application " | TOPICAL_OINTMENT | RECTAL | Status: DC | PRN

## 2016-01-04 MED ORDER — ZIDOVUDINE 10 MG/ML IV SOLN
2.0000 mg/kg | Freq: Once | INTRAVENOUS | Status: AC
  Administered 2016-01-04: 113 mg via INTRAVENOUS
  Filled 2016-01-04: qty 11.3

## 2016-01-04 MED ORDER — PENICILLIN G POTASSIUM 5000000 UNITS IJ SOLR
5.0000 10*6.[IU] | Freq: Once | INTRAMUSCULAR | Status: DC
  Filled 2016-01-04: qty 5

## 2016-01-04 MED ORDER — BETAMETHASONE SOD PHOS & ACET 6 (3-3) MG/ML IJ SUSP
12.0000 mg | Freq: Once | INTRAMUSCULAR | Status: AC
  Administered 2016-01-04: 12 mg via INTRAMUSCULAR
  Filled 2016-01-04: qty 2

## 2016-01-04 MED ORDER — TERBUTALINE SULFATE 1 MG/ML IJ SOLN: 0.2500 mg | Freq: Once | INTRAMUSCULAR | Status: DC | PRN

## 2016-01-04 MED ORDER — OXYTOCIN 40 UNITS IN LACTATED RINGERS INFUSION - SIMPLE MED
2.5000 [IU]/h | INTRAVENOUS | Status: DC
  Filled 2016-01-04: qty 1000

## 2016-01-04 MED ORDER — LACTATED RINGERS IV SOLN: 500.0000 mL | INTRAVENOUS | Status: DC | PRN

## 2016-01-04 MED ORDER — FENTANYL 2.5 MCG/ML BUPIVACAINE 1/10 % EPIDURAL INFUSION (WH - ANES)
14.0000 mL/h | INTRAMUSCULAR | Status: DC | PRN
  Administered 2016-01-04: 12 mL/h via EPIDURAL
  Filled 2016-01-04: qty 100

## 2016-01-04 MED ORDER — OXYCODONE-ACETAMINOPHEN 5-325 MG PO TABS: 1.0000 | ORAL_TABLET | ORAL | Status: DC | PRN

## 2016-01-04 MED ORDER — ZOLPIDEM TARTRATE 5 MG PO TABS: 5.0000 mg | ORAL_TABLET | Freq: Every evening | ORAL | Status: DC | PRN

## 2016-01-04 MED ORDER — ONDANSETRON HCL 4 MG PO TABS: 4.0000 mg | ORAL_TABLET | ORAL | Status: DC | PRN

## 2016-01-04 MED ORDER — TETANUS-DIPHTH-ACELL PERTUSSIS 5-2.5-18.5 LF-MCG/0.5 IM SUSP
0.5000 mL | Freq: Once | INTRAMUSCULAR | Status: AC
  Administered 2016-01-06: 0.5 mL via INTRAMUSCULAR
  Filled 2016-01-04: qty 0.5

## 2016-01-04 MED ORDER — EMTRICITABINE-TENOFOVIR AF 200-25 MG PO TABS
1.0000 | ORAL_TABLET | Freq: Every day | ORAL | Status: DC
  Administered 2016-01-04: 1 via ORAL
  Filled 2016-01-04 (×2): qty 1

## 2016-01-04 MED ORDER — PRENATAL MULTIVITAMIN CH
1.0000 | ORAL_TABLET | Freq: Every day | ORAL | Status: DC
  Administered 2016-01-05 – 2016-01-06 (×2): 1 via ORAL
  Filled 2016-01-04 (×2): qty 1

## 2016-01-04 MED ORDER — ZIDOVUDINE 10 MG/ML IV SOLN
1.0000 mg/kg/h | INTRAVENOUS | Status: DC
  Administered 2016-01-04: 1 mg/kg/h via INTRAVENOUS
  Filled 2016-01-04: qty 40

## 2016-01-04 MED ORDER — EPHEDRINE 5 MG/ML INJ: 10.0000 mg | INTRAVENOUS | Status: DC | PRN

## 2016-01-04 MED ORDER — RILPIVIRINE HCL 25 MG PO TABS
25.0000 mg | ORAL_TABLET | Freq: Every day | ORAL | Status: DC
  Administered 2016-01-04: 25 mg via ORAL
  Filled 2016-01-04: qty 1

## 2016-01-04 MED ORDER — COCONUT OIL OIL: 1.0000 "application " | TOPICAL_OIL | Status: DC | PRN

## 2016-01-04 MED ORDER — ONDANSETRON HCL 4 MG/2ML IJ SOLN: 4.0000 mg | INTRAMUSCULAR | Status: DC | PRN

## 2016-01-04 MED ORDER — LACTATED RINGERS IV SOLN: 500.0000 mL | Freq: Once | INTRAVENOUS | Status: DC

## 2016-01-04 MED ORDER — WITCH HAZEL-GLYCERIN EX PADS: 1.0000 "application " | MEDICATED_PAD | CUTANEOUS | Status: DC | PRN

## 2016-01-04 MED ORDER — RILPIVIRINE HCL 25 MG PO TABS
25.0000 mg | ORAL_TABLET | Freq: Every day | ORAL | Status: DC
  Administered 2016-01-05: 25 mg via ORAL
  Filled 2016-01-04 (×2): qty 1

## 2016-01-04 NOTE — MAU Note (Signed)
Pt thinks water broke last night, No PNC.

## 2016-01-04 NOTE — Anesthesia Procedure Notes (Signed)
Epidural Patient location during procedure: OB Start time: 01/04/2016 10:05 PM End time: 01/04/2016 10:31 PM  Staffing Anesthesiologist: [name], Damani Rando Performed: anesthesiologist   Preanesthetic Checklist Completed: patient identified, surgical consent, pre-op evaluation, timeout performed, IV checked, risks and benefits discussed and monitors and equipment checked  Epidural Patient position: sitting Prep: site prepped and draped and DuraPrep Patient monitoring: blood pressure, continuous pulse ox and heart rate Approach: midline Location: L3-L4 Injection technique: LOR air  Needle:  Needle type: Tuohy  Needle gauge: 17 G Needle length: 9 cm Needle insertion depth: 4.5 cm Catheter type: closed end flexible Catheter size: 19 Gauge Catheter at skin depth: 10 cm Test dose: negative (1% lidocaine)  Assessment Events: blood not aspirated, injection not painful, no injection resistance, negative IV test and no paresthesia  Additional Notes Pt identified in Labor room.  Monitors applied. Working IV access confirmed. Sterile prep, drape lumbar spine.  1% lido local L 3,4.  #17ga Touhy LOR air at 4.5 cm L 3,4, cath in easily to 10 cm skin. Test dose OK, cath dosed and infusion begun.  Patient asymptomatic, VSS, no heme aspirated, tolerated well.  Sherry Robbins, MDReason for block:procedure for pain

## 2016-01-04 NOTE — Progress Notes (Signed)
Patient seen. Reporting increasing pain. Waiting for epidural. Will check when comfortable. Continue PCN and AZT.

## 2016-01-04 NOTE — H&P (Signed)
Sherry Robbins is a 20 y.o. female presenting for leaking of water since 4pm yesterday.  Has not had any prenatal care. [facility] (Dr Junious Dresserachael Miller) for HIV.  States had a low viral load recently (verified in Care Everywhere 12/11/15 was <20).   . OB History    Gravida Para Term Preterm AB Living   2 1   1   1    SAB TAB Ectopic Multiple Live Births         0 1     Past Medical History:  Diagnosis Date  . HIV disease (HCC) 06/2014  . Tachycardia 10/2014   Past Surgical History:  Procedure Laterality Date  . NO PAST SURGERIES     Family History: family history includes Cancer in her maternal grandmother. Social History:  reports that she quit smoking about 16 months ago. Her smoking use included Cigars. She has never used smokeless tobacco. She reports that she does not drink alcohol or use drugs.     Maternal Diabetes: No (unknown, NPNC) Genetic Screening: Declined Maternal Ultrasounds/Referrals: Normal Fetal Ultrasounds or other Referrals:  None Maternal Substance Abuse:  No Significant Maternal Medications:  Meds include: Other:  Significant Maternal Lab Results:  Lab values include: HIV positive Other Comments:  NO prenatal care, Recent tx for syphilis in Feb 2017, Low viral load in November, On AZT now in labor  Review of Systems  Constitutional: Negative for chills, fever and malaise/fatigue.  Gastrointestinal: Negative for abdominal pain, constipation, diarrhea, nausea and vomiting.  Genitourinary: Negative for dysuria.  Neurological: Negative for dizziness and weakness.   Maternal Medical History:  Reason for admission: Rupture of membranes.  Nausea.  Contractions: Onset was 13-24 hours ago.   Frequency: irregular.   Perceived severity is mild.    Fetal activity: Perceived fetal activity is normal.   Last perceived fetal movement was within the past hour.    Prenatal complications: HIV.   No bleeding or PIH.   Prenatal Complications -  Diabetes: none.      Blood pressure 108/62, pulse 116, temperature 98.1 F (36.7 C), resp. rate 16, not currently breastfeeding. Maternal Exam:  Uterine Assessment: Contraction strength is mild.  Contraction frequency is irregular.   Abdomen: Patient reports no abdominal tenderness. Fundal height is 35.   Fetal presentation: vertex  Introitus: Normal vulva. Vagina is positive for vaginal discharge.  Amniotic fluid character: clear. Amnisure +  Cervix: Cervix evaluated by digital exam.     Fetal Exam Fetal Monitor Review: Mode: ultrasound.   Baseline rate: 145.  Variability: moderate (6-25 bpm).   Pattern: accelerations present and no decelerations.    Fetal State Assessment: Category I - tracings are normal.     Physical Exam  Constitutional: She is oriented to person, place, and time. She appears well-developed and well-nourished. No distress.  HENT:  Head: Normocephalic.  Cardiovascular: Normal rate and regular rhythm.   Respiratory: Effort normal and breath sounds normal.  GI: Soft. She exhibits no distension. There is no tenderness. There is no rebound and no guarding.  Genitourinary: Vaginal discharge found.  Musculoskeletal: Normal range of motion.  Neurological: She is alert and oriented to person, place, and time.  Skin: Skin is warm and dry.  Psychiatric: She has a normal mood and affect.    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

## 2016-01-04 NOTE — Anesthesia Pain Management Evaluation Note (Signed)
  CRNA Pain Management Visit Note  Patient: Sherry Robbins, 20 y.o., female  "Hello I am a member of the anesthesia team at [facility]. We have an anesthesia team available at all times to provide care throughout the hospital, including epidural management and anesthesia for C-section. I don't know your plan for the delivery whether it a natural birth, water birth, IV sedation, nitrous supplementation, doula or epidural, but we want to meet your pain goals."   1.Was your pain managed to your expectations on prior hospitalizations?   Yes   2.What is your expectation for pain management during this hospitalization?     Epidural  3.How can we help you reach that goal? Continued intervention  Record the patient's initial score and the patient's pain goal.   Pain: 0/10  Pain Goal: 4/10 [facility] wants you to be able to say your pain was always managed very well.  Sherry Robbins, Sherry Robbins 01/04/2016

## 2016-01-04 NOTE — Anesthesia Preprocedure Evaluation (Signed)
Anesthesia Evaluation  Patient identified by MRN, date of birth, ID band Patient awake    Reviewed: Allergy & Precautions, NPO status , Patient's Chart, lab work & pertinent test results  History of Anesthesia Complications Negative for: history of anesthetic complications  Airway Mallampati: II  TM Distance: >3 FB Neck ROM: Full    Dental  (+) Dental Advisory Given   Pulmonary former smoker,    breath sounds clear to auscultation       Cardiovascular negative cardio ROS   Rhythm:Regular Rate:Normal     Neuro/Psych negative neurological ROS     GI/Hepatic Neg liver ROS, GERD  ,  Endo/Other  negative endocrine ROS  Renal/GU negative Renal ROS     Musculoskeletal   Abdominal   Peds  Hematology  (+) Blood dyscrasia (Hb 7.9), anemia , HIV,   Anesthesia Other Findings   Reproductive/Obstetrics                             Anesthesia Physical Anesthesia Plan  ASA: III  Anesthesia Plan: Epidural   Post-op Pain Management:    Induction:   Airway Management Planned: Natural Airway  Additional Equipment:   Intra-op Plan:   Post-operative Plan:   Informed Consent: I have reviewed the patients History and Physical, chart, labs and discussed the procedure including the risks, benefits and alternatives for the proposed anesthesia with the patient or authorized representative who has indicated his/her understanding and acceptance.     Plan Discussed with: CRNA and Surgeon  Anesthesia Plan Comments: (Patient identified. Risks/Benefits/Options discussed with patient including but not limited to bleeding, infection, nerve damage, paralysis, failed block, incomplete pain control, headache, blood pressure changes, nausea, vomiting, reactions to medication both or allergic, itching and postpartum back pain. Confirmed with bedside nurse the patient's most recent platelet count. Confirmed with  patient that they are not currently taking any anticoagulation, have any bleeding history or any family history of bleeding disorders. Patient expressed understanding and wished to proceed. All questions were answered. )        Anesthesia Quick Evaluation

## 2016-01-04 NOTE — Consult Note (Signed)
[facility]  Date of Admission:  01/04/2016  Date of Consult:  01/04/2016  Reason for Consult: HIV+, Syphillis Referring Physician: Penne LashLeggett  Impression/Recommendation HIV+ Would continue descovy, intelence Check her CD4 and HIV RNA She will f/u at [facility]  35 weeks IUP PROM Has started IV AZT NO Breast feeding.  Neonatal eval of baby regarding postpartum ART.   Syphillis Treated 03-2015 (1:64 --> 1:4) Does not need re-treat at this time GC/Chlamydia (-) 12-2015 Would repeat GC/Chlamydia   Thank you so much for this interesting consult abnd for your outstanding care of this pt.   Sherry SaxJeffrey Hani Robbins (pager) [phone] [email or web address]  Sherry SereneKiana B Robbins is an 20 y.o. female.  HPI: 20 yo F with hx of HIV+ since 2016 (tested as a contact), [facility] (last visit 2 weeks ago per pt).  She comes to ED today after her water broke. She is ~ [redacted] weeks pregnant and has had no pre-natal care. She states that this was due to not being able to find a practice "that was taking new patients".  She was also found to have RPR+  HIV 1 RNA Quant (copies/mL)  Date Value  03/28/2015 <20  03/27/2015 <20    HIV RNA <20 (12-11-15) CD4  540 (12-11-15) She denies missed art (descovy, intelence).    Past Medical History:  Diagnosis Date  . HIV disease (HCC) 06/2014  . Tachycardia 10/2014    Past Surgical History:  Procedure Laterality Date  . NO PAST SURGERIES       Allergies  Allergen Reactions  . Ceftriaxone Other (See Comments)    Reaction:  Fever and cytopenia  . Cephalexin Other (See Comments)    Reaction:  Fever and cytopenia  . Zithromax [Azithromycin] Other (See Comments)    Reaction:  Fever and cytopenia    Medications:  Scheduled: . lactated ringers  500 mL Intravenous Once  . oxytocin 40 units in LR 1000 mL  500 mL Intravenous Once  . pencillin G potassium IV  3 Million Units Intravenous Q4H    Abtx:  Anti-infectives    Start     Dose/Rate Route Frequency Ordered Stop   01/04/16 1930  penicillin G potassium 3 Million Units in dextrose 50mL IVPB     3 Million Units 100 mL/hr over 30 Minutes Intravenous Every 4 hours 01/04/16 1508     01/04/16 1630  zidovudine (RETROVIR) 400 mg in dextrose 5 % 100 mL (4 mg/mL) infusion     1 mg/kg/hr  56.3 kg 14.1 mL/hr  Intravenous Continuous 01/04/16 1456     01/04/16 1530  zidovudine (RETROVIR) 113 mg in dextrose 5 % 100 mL IVPB     2 mg/kg  56.3 kg 111.3 mL/hr over 60 Minutes Intravenous  Once 01/04/16 1456 01/04/16 1758   01/04/16 1530  penicillin G potassium injection 5 Million Units  Status:  Discontinued     5 Million Units Intramuscular Once 01/04/16 1507 01/04/16 1515   01/04/16 1530  penicillin G potassium 5 Million Units in dextrose 5 % 250 mL IVPB     5 Million Units 250 mL/hr over 60 Minutes Intravenous  Once 01/04/16 1514 01/04/16 1635      Total days of antibiotics: 0          Social History:  reports that she quit smoking about 16 months ago. Her smoking use included Cigars. She has never used smokeless tobacco. She reports that she does not drink alcohol or  use drugs.  Family History  Problem Relation Age of Onset  . Cancer Maternal Grandmother     General ROS: no oral ulcers. normal BM, normal urine, denies LE edema. + fetal movements. see HPI.   Blood pressure 119/72, pulse 96, temperature 98.6 F (37 C), temperature source Oral, resp. rate 18, height 5\' 4"  (1.626 m), weight 56.3 kg (124 lb 3.2 oz), not currently breastfeeding. General appearance: alert, cooperative and no distress Eyes: negative findings: conjunctivae and sclerae normal and pupils equal, round, reactive to light and accomodation Throat: lips, mucosa, and tongue normal; teeth and gums normal Neck: no adenopathy and supple, symmetrical, trachea midline Lungs: clear to auscultation bilaterally Heart: regular rate and rhythm Abdomen: normal findings: soft, non-tender and  abnormal findings:  distended and hypoactive bowel sounds Extremities: edema none   Results for orders placed or performed during the hospital encounter of 01/04/16 (from the past 48 hour(s))  Urinalysis, Routine w reflex microscopic (not at [facility])     Status: Abnormal   Collection Time: 01/04/16 12:17 PM  Result Value Ref Range   Color, Urine YELLOW YELLOW   APPearance CLEAR CLEAR   Specific Gravity, Urine 1.015 1.005 - 1.030   pH 7.5 5.0 - 8.0   Glucose, UA NEGATIVE NEGATIVE mg/dL   Hgb urine dipstick NEGATIVE NEGATIVE   Bilirubin Urine NEGATIVE NEGATIVE   Ketones, ur NEGATIVE NEGATIVE mg/dL   Protein, ur NEGATIVE NEGATIVE mg/dL   Nitrite POSITIVE (A) NEGATIVE   Leukocytes, UA MODERATE (A) NEGATIVE  Rapid urine drug screen (hospital performed)     Status: None   Collection Time: 01/04/16 12:17 PM  Result Value Ref Range   Opiates NONE DETECTED NONE DETECTED   Cocaine NONE DETECTED NONE DETECTED   Benzodiazepines NONE DETECTED NONE DETECTED   Amphetamines NONE DETECTED NONE DETECTED   Tetrahydrocannabinol NONE DETECTED NONE DETECTED   Barbiturates NONE DETECTED NONE DETECTED    Comment:        DRUG SCREEN FOR MEDICAL PURPOSES ONLY.  IF CONFIRMATION IS NEEDED FOR ANY PURPOSE, NOTIFY LAB WITHIN 5 DAYS.        LOWEST DETECTABLE LIMITS FOR URINE DRUG SCREEN Drug Class       Cutoff (ng/mL) Amphetamine      1000 Barbiturate      200 Benzodiazepine   200 Tricyclics       300 Opiates          300 Cocaine          300 THC              50   Urine microscopic-add on     Status: Abnormal   Collection Time: 01/04/16 12:17 PM  Result Value Ref Range   Squamous Epithelial / LPF 0-5 (A) NONE SEEN   WBC, UA 6-30 0 - 5 WBC/hpf   RBC / HPF NONE SEEN 0 - 5 RBC/hpf   Bacteria, UA MANY (A) NONE SEEN  Amnisure rupture of membrane (rom)not at [facility]     Status: None   Collection Time: 01/04/16 12:52 PM  Result Value Ref Range   Amnisure ROM POSITIVE   Wet prep, genital     Status:  Abnormal   Collection Time: 01/04/16 12:52 PM  Result Value Ref Range   Yeast Wet Prep HPF POC PRESENT (A) NONE SEEN   Trich, Wet Prep NONE SEEN NONE SEEN   Clue Cells Wet Prep HPF POC PRESENT (A) NONE SEEN   WBC, Wet Prep HPF POC FEW (A)  NONE SEEN    Comment: MODERATE BACTERIA SEEN   Sperm NONE SEEN   CBC     Status: Abnormal   Collection Time: 01/04/16  3:30 PM  Result Value Ref Range   WBC 9.8 4.0 - 10.5 K/uL   RBC 4.09 3.87 - 5.11 MIL/uL   Hemoglobin 7.9 (L) 12.0 - 15.0 g/dL   HCT 65.7 (L) 84.6 - 96.2 %   MCV 64.5 (L) 78.0 - 100.0 fL   MCH 19.3 (L) 26.0 - 34.0 pg   MCHC 29.9 (L) 30.0 - 36.0 g/dL   RDW 95.2 (H) 84.1 - 32.4 %   Platelets 279 150 - 400 K/uL      Component Value Date/Time   SDES VAGINAL/RECTAL 03/27/2015 1120   SPECREQUEST NONE 03/27/2015 1120   CULT  03/27/2015 1120    NO GROUP B STREP (S.AGALACTIAE) ISOLATED Performed at [facility]    REPTSTATUS 03/29/2015 FINAL 03/27/2015 1120   No results found. Recent Results (from the past 240 hour(s))  Wet prep, genital     Status: Abnormal   Collection Time: 01/04/16 12:52 PM  Result Value Ref Range Status   Yeast Wet Prep HPF POC PRESENT (A) NONE SEEN Final   Trich, Wet Prep NONE SEEN NONE SEEN Final   Clue Cells Wet Prep HPF POC PRESENT (A) NONE SEEN Final   WBC, Wet Prep HPF POC FEW (A) NONE SEEN Final    Comment: MODERATE BACTERIA SEEN   Sperm NONE SEEN  Final      01/04/2016, 5:59 PM     LOS: 0 days    Records and images were personally reviewed where available.

## 2016-01-04 NOTE — Progress Notes (Signed)
Patient ID: Sherry Robbins, female   DOB: Dec 07, 1995, 20 y.o.   MRN: [id number] Vitals:   01/04/16 1242 01/04/16 1456  BP: 108/62 119/72  Pulse: 116 96  Resp: 16 18  Temp: 98.1 F (36.7 C) 98.6 F (37 C)  TempSrc:  Oral  Weight:  124 lb 3.2 oz (56.3 kg)  Height:  5\' 4"  (1.626 m)    FHR stable UCs occasional  Dilation: 3 Effacement (%): 80 Station: -2 Presentation: Vertex Exam by:: Evonne Rinks, cnm  VERY difficult exam, pt clamped down on my hand to extent I could not move it Will defer Foley for now Will start Pitocin  AZT started GBS PCR sent

## 2016-01-05 ENCOUNTER — Encounter (HOSPITAL_COMMUNITY): Payer: Self-pay

## 2016-01-05 LAB — CBC
HEMATOCRIT: 22.3 % — AB (ref 36.0–46.0)
Hemoglobin: 6.8 g/dL — CL (ref 12.0–15.0)
MCH: 19.6 pg — ABNORMAL LOW (ref 26.0–34.0)
MCHC: 30.5 g/dL (ref 30.0–36.0)
MCV: 64.3 fL — ABNORMAL LOW (ref 78.0–100.0)
PLATELETS: 262 10*3/uL (ref 150–400)
RBC: 3.47 MIL/uL — ABNORMAL LOW (ref 3.87–5.11)
RDW: 19.1 % — AB (ref 11.5–15.5)
WBC: 15.5 10*3/uL — AB (ref 4.0–10.5)

## 2016-01-05 LAB — PREPARE RBC (CROSSMATCH)

## 2016-01-05 LAB — HIV-1 RNA QUANT-NO REFLEX-BLD
HIV 1 RNA Quant: <20 copies/mL
LOG10 HIV-1 RNA: UNDETERMINED {Log_copies}/mL

## 2016-01-05 MED ORDER — SODIUM CHLORIDE 0.9 % IV SOLN: Freq: Once | INTRAVENOUS | Status: DC

## 2016-01-05 MED ORDER — PNEUMOCOCCAL VAC POLYVALENT 25 MCG/0.5ML IJ INJ
0.5000 mL | INJECTION | INTRAMUSCULAR | Status: AC
  Administered 2016-01-06: 0.5 mL via INTRAMUSCULAR
  Filled 2016-01-05 (×2): qty 0.5

## 2016-01-05 NOTE — Anesthesia Postprocedure Evaluation (Signed)
Anesthesia Post Note  Patient: Sherry Robbins  Procedure(s) Performed: * No procedures listed *  Patient location during evaluation: Mother Baby Anesthesia Type: Epidural Level of consciousness: awake and alert, oriented and patient cooperative Pain management: pain level controlled Vital Signs Assessment: post-procedure vital signs reviewed and stable Respiratory status: spontaneous breathing Cardiovascular status: stable Postop Assessment: no headache, epidural receding, patient able to bend at knees and no signs of nausea or vomiting Anesthetic complications: no Comments: Pain score 0.     Last Vitals:  Vitals:   01/05/16 0115 01/05/16 0215  BP: (!) 105/53 (!) 95/48  Pulse: (!) 103 92  Resp: 18 18  Temp: 37 C 36.9 C    Last Pain:  Vitals:   01/05/16 0500  TempSrc:   PainSc: 0-No pain   Pain Goal: Patients Stated Pain Goal: 7 (01/04/16 1934)               Merrilyn PumaWRINKLE,[name]

## 2016-01-05 NOTE — Progress Notes (Signed)
CRITICAL VALUE ALERT  Critical value received: Hemoglobin of 6.8  Date of notification:  01/05/2016  Time of notification:  0632  Critical value read back:Yes.    Nurse who received alert:  Denisa Enterline RN   MD notified (1st page):  Teaching service resident on call  Time of first page:  [phone]   Responding Md: Teaching service resident on call  Time MD responded:  [phone]  No new orders at this time.

## 2016-01-05 NOTE — Progress Notes (Signed)
POSTPARTUM PROGRESS NOTE  Post Partum Day 1 Subjective:  Sherry SereneKiana B Trull is a 20 y.o. [id number] 5990w5d s/p SVD.  No acute events overnight.  Pt denies problems with ambulating, voiding or po intake.  She denies nausea or vomiting.  Pain is moderately controlled.  She has had flatus. She has not had bowel movement.  Lochia Small.   Objective: Blood pressure (!) 95/48, pulse 92, temperature 98.4 F (36.9 C), temperature source Oral, resp. rate 18, height 5\' 4"  (1.626 m), weight 124 lb 3.2 oz (56.3 kg), unknown if currently breastfeeding.  Physical Exam:  General: alert, cooperative and no distress Lochia:normal flow Chest: no respiratory distress Heart:regular rate, distal pulses intact Abdomen: soft, nontender,  Uterine Fundus: firm, appropriately tender DVT Evaluation: No calf swelling or tenderness Extremities: trace edema   Recent Labs  01/04/16 1530 01/05/16 0616  HGB 7.9* 6.8*  HCT 26.4* 22.3*    Assessment/Plan:  ASSESSMENT: Sherry Robbins is a 20 y.o. [id number] 2790w5d s/p SVD  Plan for discharge tomorrow  Patient with symptomatic anemia. Plan for transfuse 2 unit PRBCs.   LOS: 1 day   Les Pouicholas SchenkMD 01/05/2016, 8:28 AM

## 2016-01-05 NOTE — Clinical Social Work Maternal (Signed)
CLINICAL SOCIAL WORK MATERNAL/CHILD NOTE  Patient Details  Name: Sherry Robbins MRN: [id number] Date of Birth: 01/02/1996  Date:  01/05/2016  Clinical Social Worker Initiating Note:  Mckinleigh Schuchart, MSW, LCSW-A   Date/ Time Initiated:  01/05/16/0848              Child's Name:  Sherry Robbins   Legal Guardian:  Mother   Need for Interpreter:  None   Date of Referral:  01/04/16     Reason for Referral:  Other (Comment) (No PNC, HIV(+) exposed newborn )   Referral Source:      Address:  [address] [place], [place] [address]  Phone number:  [phone]   Household Members: Self, Minor Children   Natural Supports (not living in the home): Immediate Family, Friends   Professional Supports:None   Employment:Full-time   Type of Work: unknown    Education:  9 to 11 years   Financial Resources:Medicaid   Other Resources: WIC   Cultural/Religious Considerations Which May Impact Care: None reported at this time.   Strengths: Ability to meet basic needs , Pediatrician chosen , Compliance with medical plan , Home prepared for child  ([facility] )   Risk Factors/Current Problems: Transportation    Cognitive State: Goal Oriented , Able to Concentrate , Alert , Insightful    Mood/Affect: Calm , Comfortable , Interested    CSW Assessment:CSW met with MOB at bedside to complete assessment. At this time, MOB was resting in bed with baby in basinet. This writer explained role and reasoning for visit being due to MOB have (+) HIV and her lack of PNC. MOB acknowledged her status and reported she did not receive prenatal care; however, was consistent with keeping up with her appointments at [facility] Infectious disease clinic. This writer praised MOB for continuing to follow-up with apts at [facility]; however, stressed the importance of PNC for her and baby. MOB verbalized understanding. This writer discussed babys specialty  care options. MOB noted she would like her and baby to both be followed at [facility] infectious disease clinic. This writer assessed MOB's preparedness for baby to go home upon d/c. MOB notes she has everything she needs as far as cloths diapers car seat and food. She notes she is on WIC and has to make follow-up apt upon d/c. At this time, MOB declines the need for any further resources. This writer assessed MOB's barriers for maintaining apts for she and baby going forward. MOB notes transportation is a barrier; however, she has supports who are available to help her when needed. This writer inquired if FOB is involved. MOB notes he is not. This writer made MOB aware of the hospitals policy and procedure regarding UDS and cord blood testing of baby's with none or LPNC. MOB verbalized understanding and noted she does not use substance. At this time, no other needs were addressed or requested thus case closed to this CSW.   CSW Plan/Description: Other (Comment), No Further Intervention Required/No Barriers to Discharge, Information/Referral to Community Resources  (CSW will continue to follow pending cord blood test and UDS results )    Delories Mauri, MSW, LCSW-A Clinical Social Worker  [facility]  Office: [phone]    CLINICAL SOCIAL WORK MATERNAL/CHILD NOTE  Patient Details  Name: [name] MRN: [id number] Date of Birth: March 04, 1995  Date:  01/05/2016  Clinical Social Worker Initiating Note:  Ferdinand Lango Ervine Witucki, MSW, LCSW-A  Date/ Time Initiated:  01/05/16/0848     Child's Name:  Sherry Robbins   Legal Guardian:  Mother   Need for Interpreter:  None   Date of Referral:  01/04/16     Reason for Referral:  Other (Comment) (No PNC, HIV(+) exposed newborn )   Referral Source:      Address:  [place], [place] [address]  Phone number:  [phone]   Household Members:  Self, Minor Children   Natural Supports (not living in the home):  Immediate Family, Friends   Professional Supports: None   Employment: Full-time   Type of Work: unknown    Education:  9 to 11 years   Museum/gallery curator Resources:  Medicaid   Other Resources:  [facility]   Cultural/Religious Considerations Which May Impact Care:  None reported at this time.   Strengths:  Ability to meet basic needs , Pediatrician chosen , Compliance with medical plan , Home prepared for child  [facility] )   Risk Factors/Current Problems:  Transportation    Cognitive State:  Goal Oriented , Able to Concentrate , Alert , Insightful    Mood/Affect:  Calm , Comfortable , Interested    CSW Assessment: CSW met with MOB at bedside to complete assessment. At this time, MOB was resting in bed with baby in basinet. This Probation officer explained role and reasoning for visit being due to MOB have (+) HIV and her lack of [place]. MOB acknowledged her status and reported she did not receive prenatal care; however, was consistent with keeping up with her appointments at [facility]ious disease clinic. This Probation officer praised MOB for continuing to follow-up with apts at [facility]; however, stressed the importance of [facility] for her and baby. MOB verbalized understanding. This Probation officer discussed babys specialty care options. MOB noted she would  like her and baby to both be followed at [facility] infectious disease clinic. This Probation officer assessed MOB's preparedness for baby to go home upon d/c. MOB notes she has everything she needs as far as cloths diapers car seat and food. She notes she is on [facility] and has to make follow-up apt upon d/c. At this time, MOB declines the need for any further resources. This Probation officer assessed MOB's barriers for maintaining apts for she and baby going forward. MOB notes transportation is a barrier; however, she has supports who are available to help her when needed. This Probation officer inquired if FOB is involved. MOB notes he is not. This Probation officer made MOB aware of the hospitals policy and procedure regarding UDS and cord blood testing of baby's with none or LPNC. MOB verbalized understanding and noted she does not use substance. At this time, no other needs were addressed or requested thus case closed to this CSW.   CSW Plan/Description:  Other (Comment), No Further Intervention Required/No Barriers to Discharge, Information/Referral to [employer]  (CSW will continue to follow pending cord blood test and UDS results )    Ferdinand Lango Avi Archuleta, MSW, [facility]  Office: [phone]

## 2016-01-06 LAB — TYPE AND SCREEN
BLOOD PRODUCT EXPIRATION DATE: 201712202359
Blood Product Expiration Date: 201712212359
ISSUE DATE / TIME: 201712031047
ISSUE DATE / TIME: 201712031415
Unit Type and Rh: 6200
Unit Type and Rh: 6200

## 2016-01-06 LAB — GC/CHLAMYDIA PROBE AMP (~~LOC~~) NOT AT ARMC
CHLAMYDIA, DNA PROBE: NEGATIVE
Neisseria Gonorrhea: NEGATIVE

## 2016-01-06 LAB — RPR: RPR: REACTIVE — AB

## 2016-01-06 LAB — RPR, QUANT+TP ABS (REFLEX)
Rapid Plasma Reagin, Quant: 1:2 {titer} — ABNORMAL HIGH
TREPONEMA PALLIDUM AB: POSITIVE — AB

## 2016-01-06 MED ORDER — EMTRICITABINE-TENOFOVIR AF 200-25 MG PO TABS: 1.0000 | ORAL_TABLET | Freq: Every day | ORAL | 11 refills | Status: DC

## 2016-01-06 MED ORDER — RILPIVIRINE HCL 25 MG PO TABS: 25.0000 mg | ORAL_TABLET | Freq: Every day | ORAL | 11 refills | Status: DC

## 2016-01-06 MED ORDER — IBUPROFEN 600 MG PO TABS: 600.0000 mg | ORAL_TABLET | Freq: Four times a day (QID) | ORAL | 0 refills | Status: DC

## 2016-01-06 MED ORDER — FERROUS SULFATE 325 (65 FE) MG PO TABS: 325.0000 mg | ORAL_TABLET | Freq: Every day | ORAL | 0 refills | Status: DC

## 2016-01-06 NOTE — Progress Notes (Signed)
UR chart review completed.  

## 2016-01-06 NOTE — Discharge Summary (Signed)
OB Discharge Summary  Patient Name: Sherry Robbins DOB: [birth year] MRN: [id number]  Date of admission: 01/04/2016 Delivering MD: Ernestina PennaSCHENK, NICHOLAS MICHAEL   Date of discharge: 01/06/2016  Admitting diagnosis: 35WKS WATER BROKE  Intrauterine pregnancy: 9261w5d     Secondary diagnosis:Active Problems:   Preterm premature rupture of membranes (PPROM) with unknown onset of labor  Additional problems:anemia, HIV+     Discharge diagnosis: Term Pregnancy Delivered                                                                     Post partum procedures:blood transfusion  Augmentation: n/a  Complications: None  Hospital course:  Onset of Labor With Vaginal Delivery     20 y.o. yo G2P0202 at 2961w5d was admitted in Latent Labor on 01/04/2016. Patient had an uncomplicated labor course as follows:  Membrane Rupture Time/Date: 3:00 PM ,01/03/2016   Intrapartum Procedures: Episiotomy: None [1]                                         Lacerations:  Periurethral [8]  Patient had a delivery of a Viable infant. 01/04/2016  Information for the patient's newborn:  Sherry Robbins, Boy Sherry Robbins [[id number]][[id number]]  Delivery Method: Vaginal, Spontaneous Delivery (Filed from Delivery Summary)    Pateint had an uncomplicated postpartum course.  She is ambulating, tolerating a regular diet, passing flatus, and urinating well. Patient is discharged home in stable condition on 01/06/16.    Physical exam Vitals:   01/05/16 1411 01/05/16 1435 01/05/16 1715 01/06/16 0632  BP: (!) 103/54 110/67 (!) 99/48 (!) 89/50  Pulse: 70 68 83 (!) 59  Resp: 18 18 18 18   Temp: 98.3 F (36.8 C) 98.4 F (36.9 C) 98.2 F (36.8 C) 98.3 F (36.8 C)  TempSrc: Oral Oral Oral Oral  SpO2:   100%   Weight:      Height:       General: alert, cooperative and no distress Lochia: appropriate Uterine Fundus: firm Incision: N/A DVT Evaluation: No evidence of DVT seen on physical exam. Labs: Lab Results  Component Value Date   WBC 15.0  (H) 01/06/2016   HGB 9.7 (L) 01/06/2016   HCT 30.6 (L) 01/06/2016   MCV 68.8 (L) 01/06/2016   PLT 252 01/06/2016   CMP Latest Ref Rng & Units 03/27/2015  Glucose 65 - 99 mg/dL 784(O106(H)  BUN 6 - 20 mg/dL <9(G<5(L)  Creatinine 2.950.44 - 1.00 mg/dL 2.840.52  Sodium 132135 - 440145 mmol/L 137  Potassium 3.5 - 5.1 mmol/L 3.4(L)  Chloride 101 - 111 mmol/L 108  CO2 22 - 32 mmol/L 19(L)  Calcium 8.9 - 10.3 mg/dL 8.3(L)  Total Protein 6.0 - 8.3 g/dL -  Total Bilirubin 0.2 - 1.1 mg/dL -  Alkaline Phos 39 - 102117 U/L -  AST 0 - 37 U/L -  ALT 0 - 35 U/L -    Discharge instruction: per After Visit Summary and "Sherry Robbins and Me Booklet".  After Visit Meds:    Medication List    TAKE these medications   DESCOVY 200-25 MG tablet Generic drug:  emtricitabine-tenofovir AF Take 1 tablet by mouth  daily with supper. What changed:  Another medication with the same name was added. Make sure you understand how and when to take each.   emtricitabine-tenofovir AF 200-25 MG tablet Commonly known as:  DESCOVY Take 1 tablet by mouth daily with supper. What changed:  You were already taking a medication with the same name, and this prescription was added. Make sure you understand how and when to take each.   ferrous sulfate 325 (65 FE) MG tablet Take 1 tablet (325 mg total) by mouth daily.   ibuprofen 600 MG tablet Commonly known as:  ADVIL,MOTRIN Take 1 tablet (600 mg total) by mouth every 6 (six) hours.   PRENATAL GUMMIES/DHA & FA 0.4-32.5 MG Chew Chew 2 each by mouth daily.   rilpivirine 25 MG Tabs tablet Commonly known as:  EDURANT Take 25 mg by mouth daily with supper. What changed:  Another medication with the same name was added. Make sure you understand how and when to take each.   rilpivirine 25 MG Tabs tablet Commonly known as:  EDURANT Take 1 tablet (25 mg total) by mouth daily with supper. What changed:  You were already taking a medication with the same name, and this prescription was added. Make sure  you understand how and when to take each.       Diet: routine diet  Activity: Advance as tolerated. Pelvic rest for 6 weeks.   Outpatient follow up:6 weeks Follow up Appt:Future Appointments Date Time Provider Department Center  01/07/2016 2:45 PM Roe Coombsachelle A Denney, CNM [facility] None   Follow up visit: No Follow-up on file.  Postpartum contraception: Depo Provera  Newborn Data: Live born female  Birth Weight: 5 lb 1.3 oz (2305 g) APGAR: 8, 9  Sherry Robbins Feeding: bottle Disposition:home with mother   01/06/2016 Sherry Robbins, CNM

## 2016-01-07 ENCOUNTER — Encounter: Payer: Medicaid Other | Admitting: Certified Nurse Midwife

## 2016-01-07 LAB — CBC
HCT: 30.6 % — ABNORMAL LOW (ref 36.0–46.0)
Hemoglobin: 9.7 g/dL — ABNORMAL LOW (ref 12.0–15.0)
MCH: 21.8 pg — ABNORMAL LOW (ref 26.0–34.0)
MCHC: 31.7 g/dL (ref 30.0–36.0)
MCV: 68.8 fL — ABNORMAL LOW (ref 78.0–100.0)
PLATELETS: 252 10*3/uL (ref 150–400)
RBC: 4.45 MIL/uL (ref 3.87–5.11)
RDW: 20.6 % — AB (ref 11.5–15.5)
WBC: 15 10*3/uL — AB (ref 4.0–10.5)

## 2016-02-11 ENCOUNTER — Other Ambulatory Visit: Payer: Self-pay | Admitting: Obstetrics and Gynecology

## 2016-02-18 ENCOUNTER — Telehealth: Payer: Self-pay | Admitting: *Deleted

## 2016-02-18 MED ORDER — IBUPROFEN 600 MG PO TABS: 600.0000 mg | ORAL_TABLET | Freq: Four times a day (QID) | ORAL | 0 refills | Status: DC

## 2016-02-18 MED ORDER — FERROUS SULFATE 325 (65 FE) MG PO TABS: 325.0000 mg | ORAL_TABLET | Freq: Every day | ORAL | 0 refills | Status: DC

## 2016-02-18 NOTE — Telephone Encounter (Signed)
[facility] request for refills of ibuprofen and iron tablets. Per chart review, pt was given these medications @ discharge from hospital following preterm birth of infant. Pt had appt to begin prenatal care @ [facility] on 12/5 but delivered on 12/4, therefore she no prenatal care and does not currently have PP appt scheduled. I e-prescribed refills of the requested medications and [facility] for PP appt to be scheduled.

## 2016-02-26 ENCOUNTER — Ambulatory Visit: Payer: Medicaid Other | Admitting: Student

## 2016-11-28 ENCOUNTER — Other Ambulatory Visit: Payer: Self-pay | Admitting: Obstetrics and Gynecology

## 2017-04-07 ENCOUNTER — Other Ambulatory Visit: Payer: Self-pay | Admitting: Obstetrics and Gynecology

## 2017-04-09 ENCOUNTER — Other Ambulatory Visit: Payer: Self-pay | Admitting: Obstetrics and Gynecology

## 2017-08-26 ENCOUNTER — Other Ambulatory Visit: Payer: Self-pay

## 2017-08-26 ENCOUNTER — Ambulatory Visit: Payer: Medicaid Other

## 2017-08-26 ENCOUNTER — Other Ambulatory Visit (HOSPITAL_COMMUNITY)
Admission: RE | Admit: 2017-08-26 | Discharge: 2017-08-26 | Disposition: A | Discharge status: Home / Self Care | Payer: Medicaid Other | Source: Ambulatory Visit | Attending: Family | Admitting: Family

## 2017-08-26 DIAGNOSIS — B20 Human immunodeficiency virus [HIV] disease: Secondary | ICD-10-CM | POA: Diagnosis not present

## 2017-08-26 DIAGNOSIS — Z79899 Other long term (current) drug therapy: Secondary | ICD-10-CM

## 2017-08-26 DIAGNOSIS — A64 Unspecified sexually transmitted disease: Secondary | ICD-10-CM | POA: Diagnosis not present

## 2017-08-26 DIAGNOSIS — Z113 Encounter for screening for infections with a predominantly sexual mode of transmission: Secondary | ICD-10-CM

## 2017-08-26 NOTE — Addendum Note (Signed)
Addended by: Mariea ClontsGREEN, Ark Agrusa D on: 08/26/2017 04:18 PM   Modules accepted: Orders

## 2017-08-26 NOTE — Addendum Note (Signed)
Addended by: Lurlean LeydenPOOLE, TRAVIS F on: 08/26/2017 03:51 PM   Modules accepted: Orders

## 2017-08-27 LAB — COMPLETE METABOLIC PANEL WITH GFR
AG RATIO: 1.3 (calc) (ref 1.0–2.5)
ALT: 10 U/L (ref 6–29)
AST: 15 U/L (ref 10–30)
Albumin: 4.2 g/dL (ref 3.6–5.1)
Alkaline phosphatase (APISO): 80 U/L (ref 33–115)
BILIRUBIN TOTAL: 0.7 mg/dL (ref 0.2–1.2)
BUN: 7 mg/dL (ref 7–25)
CHLORIDE: 106 mmol/L (ref 98–110)
CO2: 26 mmol/L (ref 20–32)
Calcium: 9.4 mg/dL (ref 8.6–10.2)
Creat: 0.85 mg/dL (ref 0.50–1.10)
GFR, Est African American: 113 mL/min/{1.73_m2} (ref >=60)
GFR, Est Non African American: 97 mL/min/{1.73_m2} (ref >=60)
Globulin: 3.2 g/dL (calc) (ref 1.9–3.7)
Glucose, Bld: 89 mg/dL (ref 65–99)
POTASSIUM: 3.5 mmol/L (ref 3.5–5.3)
Sodium: 141 mmol/L (ref 135–146)
Total Protein: 7.4 g/dL (ref 6.1–8.1)

## 2017-08-27 LAB — CBC WITH DIFFERENTIAL/PLATELET
BASOS PCT: 0.4 %
Basophils Absolute: 21 cells/uL (ref 0–200)
Eosinophils Absolute: 42 cells/uL (ref 15–500)
Eosinophils Relative: 0.8 %
HCT: 37.1 % (ref 35.0–45.0)
HEMOGLOBIN: 11.9 g/dL (ref 11.7–15.5)
Lymphs Abs: 1498 cells/uL (ref 850–3900)
MCH: 24.8 pg — ABNORMAL LOW (ref 27.0–33.0)
MCHC: 32.1 g/dL (ref 32.0–36.0)
MCV: 77.3 fL — ABNORMAL LOW (ref 80.0–100.0)
MONOS PCT: 8.8 %
MPV: 11.5 fL (ref 7.5–12.5)
NEUTROS ABS: 3182 {cells}/uL (ref 1500–7800)
Neutrophils Relative %: 61.2 %
PLATELETS: 229 10*3/uL (ref 140–400)
RBC: 4.8 10*6/uL (ref 3.80–5.10)
RDW: 15.7 % — ABNORMAL HIGH (ref 11.0–15.0)
Total Lymphocyte: 28.8 %
WBC mixed population: 458 cells/uL (ref 200–950)
WBC: 5.2 10*3/uL (ref 3.8–10.8)

## 2017-08-27 LAB — URINE CYTOLOGY ANCILLARY ONLY
CHLAMYDIA, DNA PROBE: NEGATIVE
NEISSERIA GONORRHEA: NEGATIVE

## 2017-08-27 LAB — HEPATITIS B CORE ANTIBODY, TOTAL

## 2017-08-27 LAB — LIPID PANEL
Cholesterol: 233 mg/dL — ABNORMAL HIGH (ref <200)
HDL: 55 mg/dL (ref >50)
LDL Cholesterol (Calc): 147 mg/dL (calc) — ABNORMAL HIGH
NON-HDL CHOLESTEROL (CALC): 178 mg/dL — AB (ref <130)
TRIGLYCERIDES: 177 mg/dL — AB (ref <150)
Total CHOL/HDL Ratio: 4.2 (calc) (ref <5.0)

## 2017-08-27 LAB — URINALYSIS
Bilirubin Urine: NEGATIVE
Glucose, UA: NEGATIVE
NITRITE: POSITIVE — AB
Specific Gravity, Urine: 1.019 (ref 1.001–1.03)
pH: 6 (ref 5.0–8.0)

## 2017-08-27 LAB — HEPATITIS B SURFACE ANTIBODY,QUALITATIVE

## 2017-08-27 LAB — HLA B*5701

## 2017-08-27 LAB — FLUORESCENT TREPONEMAL AB(FTA)-IGG-BLD: FLUORESCENT TREPONEMAL ABS: REACTIVE — AB

## 2017-08-27 LAB — HEPATITIS B SURFACE ANTIGEN

## 2017-08-27 LAB — HIV ANTIBODY (ROUTINE TESTING W REFLEX)

## 2017-08-27 LAB — T-HELPER CELL (CD4) - (RCID CLINIC ONLY)
CD4 % Helper T Cell: 38 % (ref 33–55)
CD4 T CELL ABS: 550 /uL (ref 400–2700)

## 2017-08-27 LAB — RPR: RPR: REACTIVE — AB

## 2017-08-27 LAB — HEPATITIS A ANTIBODY, TOTAL

## 2017-08-27 LAB — RPR TITER

## 2017-08-27 LAB — HEPATITIS C ANTIBODY

## 2017-08-28 LAB — QUANTIFERON-TB GOLD PLUS
Mitogen-NIL: >10 IU/mL
NIL: 0.03 [IU]/mL
QuantiFERON-TB Gold Plus: NEGATIVE
TB1-NIL: 0 [IU]/mL
TB2-NIL: 0 IU/mL

## 2017-08-31 LAB — HIV-1 RNA ULTRAQUANT REFLEX TO GENTYP+
HIV 1 RNA QUANT: 25 {copies}/mL — AB
HIV-1 RNA Quant, Log: 1.4 Log copies/mL — ABNORMAL HIGH

## 2017-09-01 ENCOUNTER — Encounter: Payer: Self-pay | Admitting: Infectious Diseases

## 2017-09-07 LAB — HEPATITIS C ANTIBODY
Hepatitis C Ab: NONREACTIVE
SIGNAL TO CUT-OFF: 0.15 (ref <1.00)

## 2017-09-07 LAB — HLA B*5701: HLA-B 5701 W/RFLX HLA-B HIGH: NEGATIVE

## 2017-09-07 LAB — HIV ANTIBODY (ROUTINE TESTING W REFLEX)

## 2017-09-07 LAB — HEPATITIS A ANTIBODY, TOTAL: Hepatitis A AB,Total: REACTIVE — AB

## 2017-09-07 LAB — HEPATITIS B SURFACE ANTIBODY,QUALITATIVE: HEP B S AB: NONREACTIVE

## 2017-09-07 LAB — HEPATITIS B SURFACE ANTIGEN: Hepatitis B Surface Ag: NONREACTIVE

## 2017-09-07 LAB — HEPATITIS B CORE ANTIBODY, TOTAL: HEP B C TOTAL AB: NONREACTIVE

## 2017-09-10 ENCOUNTER — Encounter: Payer: Self-pay | Admitting: Family

## 2017-09-10 ENCOUNTER — Ambulatory Visit (INDEPENDENT_AMBULATORY_CARE_PROVIDER_SITE_OTHER): Payer: Medicaid Other | Admitting: Pharmacist

## 2017-09-10 ENCOUNTER — Ambulatory Visit (INDEPENDENT_AMBULATORY_CARE_PROVIDER_SITE_OTHER): Payer: Medicaid Other | Admitting: Family

## 2017-09-10 VITALS — BP 113/73 | HR 93 | Temp 98.6°F | Wt 132.8 lb

## 2017-09-10 DIAGNOSIS — Z23 Encounter for immunization: Secondary | ICD-10-CM | POA: Diagnosis not present

## 2017-09-10 DIAGNOSIS — B20 Human immunodeficiency virus [HIV] disease: Secondary | ICD-10-CM

## 2017-09-10 MED ORDER — EMTRICITAB-RILPIVIR-TENOFOV AF 200-25-25 MG PO TABS: 1.0000 | ORAL_TABLET | Freq: Every day | ORAL | 3 refills | Status: DC

## 2017-09-10 NOTE — Progress Notes (Signed)
Patient here to see Tammy SoursGreg and is doing well on Odefsey.  I introduced myself to her and gave her my card to call me with any issues in the future.

## 2017-09-10 NOTE — Progress Notes (Signed)
Subjective:    Patient ID: Sherry Robbins, female    DOB: 07/20/1995, 22 y.o.   MRN: [id number]  Chief Complaint  Patient presents with  . HIV Positive/AIDS    HPI:  Sherry Robbins is a 22 y.o. female who presents today to transfer/establish care for her HIV disease.   Sherry Robbins was initially diagnosed with HIV-1 on 06/04/14 as she was referred to [facility] by the Health Department/DIS as she was tested because of concern for symptoms she was experiencing. She had a previous negative HIV test in April 2015. She acquired HIV through heterosexual contact. Her initial viral load was 86,000 with a CD4 count of 320. She was initially started on Descoy and Rilpivirine. She was most recently prescribed Odefsey and was last seen by her previous provider in November 2018. Her most recent blood work completed on 08/26/17 shows a viral load of 25 and a CD4 count of 550. She is not immune to Hepatitis B and has no current infection with Hepatitis B or C. HLA-B5701 is negative. 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 She was previously treated in the past.   Sherry Robbins reports taking her Vernell Leep as prescribed with no adverse side effects or missed doses. She has only 1 pill left from her previous prescription. She has no problems obtaining or taking her medications. Denies fevers, chills, night sweats, headaches, changes in vision, neck pain/stiffness, nausea, diarrhea, vomiting, lesions or rashes.    Allergies  Allergen Reactions  . Ceftriaxone Other (See Comments)    Reaction:  Fever and cytopenia  . Cephalexin Other (See Comments)    Reaction:  Fever and cytopenia  . Zithromax [Azithromycin] Other (See Comments)    Reaction:  Fever and cytopenia      Outpatient Medications Prior to Visit  Medication Sig Dispense Refill  . emtricitabine-tenofovir AF (DESCOVY) 200-25 MG tablet Take 1 tablet by mouth daily with supper. 30 tablet 11  . rilpivirine (EDURANT) 25 MG  TABS tablet Take 1 tablet (25 mg total) by mouth daily with supper. 30 tablet 11  . emtricitabine-rilpivir-tenofovir AF (ODEFSEY) 200-25-25 MG TABS tablet Take 1 tablet by mouth daily with breakfast.    . emtricitabine-tenofovir AF (DESCOVY) 200-25 MG tablet Take 1 tablet by mouth daily with supper.     . ferrous sulfate 325 (65 FE) MG tablet Take 1 tablet (325 mg total) by mouth daily. (Patient not taking: Reported on 09/10/2017) 30 tablet 0  . ibuprofen (ADVIL,MOTRIN) 600 MG tablet Take 1 tablet (600 mg total) by mouth every 6 (six) hours. (Patient not taking: Reported on 09/10/2017) 30 tablet 0  . Prenatal MV-Min-FA-Omega-3 (PRENATAL GUMMIES/DHA & FA) 0.4-32.5 MG CHEW Chew 2 each by mouth daily.    . rilpivirine (EDURANT) 25 MG TABS tablet Take 25 mg by mouth daily with supper.     No facility-administered medications prior to visit.      Past Medical History:  Diagnosis Date  . HIV disease ([place]) 06/2014  . Tachycardia 10/2014      Past Surgical History:  Procedure Laterality Date  . NO PAST SURGERIES        Family History  Problem Relation Age of Onset  . Cancer Maternal Grandmother       Social History   Socioeconomic History  . Marital status: Single    Spouse name: Not on file  . Number of children: Not on file  . Years of education: Not on file  .  Highest education level: Not on file  Occupational History  . Not on file  Social Needs  . Financial resource strain: Not on file  . Food insecurity:    Worry: Not on file    Inability: Not on file  . Transportation needs:    Medical: Not on file    Non-medical: Not on file  Tobacco Use  . Smoking status: Former Smoker    Types: Cigars    Last attempt to quit: 08/30/2014    Years since quitting: 3.0  . Smokeless tobacco: Never Used  Substance and Sexual Activity  . Alcohol use: No  . Drug use: No  . Sexual activity: Yes    Birth control/protection: Condom  Lifestyle  . Physical activity:    Days per week:  Not on file    Minutes per session: Not on file  . Stress: Not on file  Relationships  . Social connections:    Talks on phone: Not on file    Gets together: Not on file    Attends religious service: Not on file    Active member of club or organization: Not on file    Attends meetings of clubs or organizations: Not on file    Relationship status: Not on file  . Intimate partner violence:    Fear of current or ex partner: Not on file    Emotionally abused: Not on file    Physically abused: Not on file    Forced sexual activity: Not on file  Other Topics Concern  . Not on file  Social History Narrative  . Not on file      Review of Systems  Constitutional: Negative for appetite change, chills, diaphoresis, fatigue, fever and unexpected weight change.  Eyes:       Negative for acute change in vision  Respiratory: Negative for chest tightness, shortness of breath and wheezing.   Cardiovascular: Negative for chest pain.  Gastrointestinal: Negative for diarrhea, nausea and vomiting.  Genitourinary: Negative for dysuria, pelvic pain and vaginal discharge.  Musculoskeletal: Negative for neck pain and neck stiffness.  Skin: Negative for rash.  Neurological: Negative for seizures, syncope, weakness and headaches.  Hematological: Negative for adenopathy. Does not bruise/bleed easily.  Psychiatric/Behavioral: Negative for hallucinations.       Objective:    BP 113/73   Pulse 93   Temp 98.6 F (37 C)   Wt 132 lb 12.8 oz (60.2 kg)   LMP 08/26/2017   Breastfeeding? No   BMI 22.80 kg/m  Nursing note and vital signs reviewed.  Physical Exam  Constitutional: She is oriented to person, place, and time. She appears well-developed. No distress.  HENT:  Mouth/Throat: Oropharynx is clear and moist.  Eyes: Conjunctivae are normal.  Neck: Neck supple.  Cardiovascular: Normal rate, regular rhythm, normal heart sounds and intact distal pulses. Exam reveals no gallop and no friction  rub.  No murmur heard. Pulmonary/Chest: Effort normal and breath sounds normal. No respiratory distress. She has no wheezes. She has no rales. She exhibits no tenderness.  Abdominal: Soft. Bowel sounds are normal. There is no tenderness.  Lymphadenopathy:    She has no cervical adenopathy.  Neurological: She is alert and oriented to person, place, and time.  Skin: Skin is warm and dry. No rash noted.  Psychiatric: She has a normal mood and affect. Her behavior is normal. Judgment and thought content normal.        Assessment & Plan:   Problem List Items Addressed This  Visit      Other   HIV disease [facility]) - Primary    Sherry Robbins has well controlled HIV with her current regimen of Odefsey. She is adherent to the medication regimen and has no problems obtaining it. She has no current signs/symptoms of opportunistic infection through history of physical exam. Menveo updated today. Continue current dose of Odefsey. Plan for follow up office visit in 3 months or sooner if needed with lab work 1-2 weeks prior to her appointment. Recommend nurse visit for flu vaccination in September.       Relevant Medications   emtricitabine-rilpivir-tenofovir AF (ODEFSEY) 200-25-25 MG TABS tablet   Other Relevant Orders   HIV 1 RNA quant-no reflex-bld   T-helper cell (CD4)- ([facility] clinic only)   MENINGOCOCCAL MCV4O (Completed)       I am having Sherry Robbins maintain her emtricitabine-tenofovir AF, rilpivirine, PRENATAL GUMMIES/DHA & FA, rilpivirine, emtricitabine-tenofovir AF, ferrous sulfate, ibuprofen, and emtricitabine-rilpivir-tenofovir AF.   Meds ordered this encounter  Medications  . emtricitabine-rilpivir-tenofovir AF (ODEFSEY) 200-25-25 MG TABS tablet    Sig: Take 1 tablet by mouth daily with breakfast.    Dispense:  30 tablet    Refill:  3    Order Specific Question:   Supervising Provider    Answer:   Carlyle Basques [[id number]]     Follow-up: Return in about 3 months (around  12/11/2017).    Terri Piedra, MSN, FNP-C Nurse Practitioner [facility] [place] [facility] Office phone: [phone] Pager: [place] number: [phone]

## 2017-09-10 NOTE — Assessment & Plan Note (Signed)
[name] has well controlled HIV with her current regimen of Odefsey. She is adherent to the medication regimen and has no problems obtaining it. She has no current signs/symptoms of opportunistic infection through history of physical exam. Menveo updated today. Continue current dose of Odefsey. Plan for follow up office visit in 3 months or sooner if needed with lab work 1-2 weeks prior to her appointment. Recommend nurse visit for flu vaccination in September.

## 2017-09-10 NOTE — Patient Instructions (Addendum)
Nice to meet you.  Please bring your bills to [place].  [facility] to [facility] your [facility] as prescribed.   Plan for follow up in 3 months or sooner if needed with blood work 1-2 weeks prior to appointment.   Please schedule a nurse visit for a flu shot in September.

## 2017-09-14 ENCOUNTER — Ambulatory Visit: Payer: Medicaid Other | Admitting: Infectious Diseases

## 2017-12-15 ENCOUNTER — Telehealth: Payer: Self-pay

## 2017-12-15 NOTE — Telephone Encounter (Signed)
Patient is on overdue pap list. Need to ask patient if she has received a pap smear recently. If not patient is able to schedule pap appointment with Rexene AlbertsStephanie Dixon, Np. Unable to reach patient at this time left voicemail asking patient to call office back. Lorenso CourierJose L Maldonado, [place]

## 2017-12-30 ENCOUNTER — Other Ambulatory Visit: Payer: Self-pay | Admitting: Family

## 2017-12-30 DIAGNOSIS — B20 Human immunodeficiency virus [HIV] disease: Secondary | ICD-10-CM

## 2018-01-03 ENCOUNTER — Other Ambulatory Visit: Payer: Self-pay | Admitting: Behavioral Health

## 2018-02-04 ENCOUNTER — Other Ambulatory Visit: Payer: Self-pay | Admitting: Family

## 2018-02-04 DIAGNOSIS — B20 Human immunodeficiency virus [HIV] disease: Secondary | ICD-10-CM

## 2018-02-08 ENCOUNTER — Other Ambulatory Visit: Payer: Self-pay | Admitting: Behavioral Health

## 2018-02-08 DIAGNOSIS — B20 Human immunodeficiency virus [HIV] disease: Secondary | ICD-10-CM

## 2018-02-08 MED ORDER — EMTRICITAB-RILPIVIR-TENOFOV AF 200-25-25 MG PO TABS: 1.0000 | ORAL_TABLET | Freq: Every day | ORAL | 0 refills | Status: DC

## 2018-02-09 ENCOUNTER — Other Ambulatory Visit: Payer: Medicaid Other

## 2018-02-09 ENCOUNTER — Ambulatory Visit: Payer: Medicaid Other | Admitting: Infectious Diseases

## 2018-02-10 ENCOUNTER — Ambulatory Visit: Payer: Medicaid Other | Admitting: Infectious Diseases

## 2018-02-11 ENCOUNTER — Other Ambulatory Visit (HOSPITAL_COMMUNITY)
Admission: RE | Admit: 2018-02-11 | Discharge: 2018-02-11 | Disposition: A | Discharge status: Home / Self Care | Payer: Medicaid Other | Source: Ambulatory Visit | Attending: Infectious Diseases | Admitting: Infectious Diseases

## 2018-02-11 ENCOUNTER — Other Ambulatory Visit: Payer: Medicaid Other

## 2018-02-11 ENCOUNTER — Encounter: Payer: Self-pay | Admitting: Infectious Diseases

## 2018-02-11 ENCOUNTER — Ambulatory Visit (INDEPENDENT_AMBULATORY_CARE_PROVIDER_SITE_OTHER): Payer: Medicaid Other | Admitting: Infectious Diseases

## 2018-02-11 ENCOUNTER — Other Ambulatory Visit: Payer: Self-pay | Admitting: *Deleted

## 2018-02-11 VITALS — BP 131/79 | HR 112

## 2018-02-11 DIAGNOSIS — Z3042 Encounter for surveillance of injectable contraceptive: Secondary | ICD-10-CM

## 2018-02-11 DIAGNOSIS — Z124 Encounter for screening for malignant neoplasm of cervix: Secondary | ICD-10-CM | POA: Diagnosis not present

## 2018-02-11 DIAGNOSIS — Z30013 Encounter for initial prescription of injectable contraceptive: Secondary | ICD-10-CM

## 2018-02-11 DIAGNOSIS — B20 Human immunodeficiency virus [HIV] disease: Secondary | ICD-10-CM

## 2018-02-11 DIAGNOSIS — Z881 Allergy status to other antibiotic agents status: Secondary | ICD-10-CM

## 2018-02-11 DIAGNOSIS — N949 Unspecified condition associated with female genital organs and menstrual cycle: Secondary | ICD-10-CM

## 2018-02-11 LAB — T-HELPER CELL (CD4) - (RCID CLINIC ONLY)
CD4 % Helper T Cell: 43 % (ref 33–55)
CD4 T Cell Abs: 1080 /uL (ref 400–2700)

## 2018-02-11 LAB — POCT URINE PREGNANCY: PREG TEST UR: NEGATIVE

## 2018-02-11 MED ORDER — MEDROXYPROGESTERONE ACETATE 150 MG/ML IM SUSP
150.0000 mg | Freq: Once | INTRAMUSCULAR | Status: AC
  Administered 2018-02-11: 150 mg via INTRAMUSCULAR

## 2018-02-11 NOTE — Patient Instructions (Addendum)
Every 12 weeks (3 months) for your birth control shots - you may have some break through bleeding (light spotting) occasionally while your body gets used to the medication.   Will call you with results for your test today.   I want to also get you referred to Allergy doctor to help better understand if you have an allergy to some of the antibiotics you got in 2016.     Medroxyprogesterone injection [Contraceptive] What is this medicine? MEDROXYPROGESTERONE (me DROX ee proe JES te rone) contraceptive injections prevent pregnancy. They provide effective birth control for 3 months. Depo-subQ Provera 104 is also used for treating pain related to endometriosis. This medicine may be used for other purposes; ask your health care provider or pharmacist if you have questions. COMMON BRAND NAME(S): Depo-Provera, Depo-subQ Provera 104 What should I tell my health care provider before I take this medicine? They need to know if you have any of these conditions: -frequently drink alcohol -asthma -blood vessel disease or a history of a blood clot in the lungs or legs -bone disease such as osteoporosis -breast cancer -diabetes -eating disorder (anorexia nervosa or bulimia) -high blood pressure -HIV infection or AIDS -kidney disease -liver disease -mental depression -migraine -seizures (convulsions) -stroke -tobacco smoker -vaginal bleeding -an unusual or allergic reaction to medroxyprogesterone, other hormones, medicines, foods, dyes, or preservatives -pregnant or trying to get pregnant -breast-feeding How should I use this medicine? Depo-Provera Contraceptive injection is given into a muscle. Depo-subQ Provera 104 injection is given under the skin. These injections are given by a health care professional. You must not be pregnant before getting an injection. The injection is usually given during the first 5 days after the start of a menstrual period or 6 weeks after delivery of a baby. Talk to  your pediatrician regarding the use of this medicine in children. Special care may be needed. These injections have been used in female children who have started having menstrual periods. Overdosage: If you think you have taken too much of this medicine contact a poison control center or emergency room at once. NOTE: This medicine is only for you. Do not share this medicine with others. What if I miss a dose? Try not to miss a dose. You must get an injection once every 3 months to maintain birth control. If you cannot keep an appointment, call and reschedule it. If you wait longer than 13 weeks between Depo-Provera contraceptive injections or longer than 14 weeks between Depo-subQ Provera 104 injections, you could get pregnant. Use another method for birth control if you miss your appointment. You may also need a pregnancy test before receiving another injection. What may interact with this medicine? Do not take this medicine with any of the following medications: -bosentan This medicine may also interact with the following medications: -aminoglutethimide -antibiotics or medicines for infections, especially rifampin, rifabutin, rifapentine, and griseofulvin -aprepitant -barbiturate medicines such as phenobarbital or primidone -bexarotene -carbamazepine -medicines for seizures like ethotoin, felbamate, oxcarbazepine, phenytoin, topiramate -modafinil -St. John's wort This list may not describe all possible interactions. Give your health care provider a list of all the medicines, herbs, non-prescription drugs, or dietary supplements you use. Also tell them if you smoke, drink alcohol, or use illegal drugs. Some items may interact with your medicine. What should I watch for while using this medicine? This drug does not protect you against HIV infection (AIDS) or other sexually transmitted diseases. Use of this product may cause you to lose calcium from your bones. Loss  of calcium may cause weak  bones (osteoporosis). Only use this product for more than 2 years if other forms of birth control are not right for you. The longer you use this product for birth control the more likely you will be at risk for weak bones. Ask your health care professional how you can keep strong bones. You may have a change in bleeding pattern or irregular periods. Many females stop having periods while taking this drug. If you have received your injections on time, your chance of being pregnant is very low. If you think you may be pregnant, see your health care professional as soon as possible. Tell your health care professional if you want to get pregnant within the next year. The effect of this medicine may last a long time after you get your last injection. What side effects may I notice from receiving this medicine? Side effects that you should report to your doctor or health care professional as soon as possible: -allergic reactions like skin rash, itching or hives, swelling of the face, lips, or tongue -breast tenderness or discharge -breathing problems -changes in vision -depression -feeling faint or lightheaded, falls -fever -pain in the abdomen, chest, groin, or leg -problems with balance, talking, walking -unusually weak or tired -yellowing of the eyes or skin Side effects that usually do not require medical attention (report to your doctor or health care professional if they continue or are bothersome): -acne -fluid retention and swelling -headache -irregular periods, spotting, or absent periods -temporary pain, itching, or skin reaction at site where injected -weight gain This list may not describe all possible side effects. Call your doctor for medical advice about side effects. You may report side effects to FDA at 1-800-FDA-1088. Where should I keep my medicine? This does not apply. The injection will be given to you by a health care professional. NOTE: This sheet is a summary. It may not  cover all possible information. If you have questions about this medicine, talk to your doctor, pharmacist, or health care provider.  2019 Elsevier/Gold Standard (2008-02-10 18:37:56)

## 2018-02-11 NOTE — Progress Notes (Signed)
      Subjective:    Sherry Robbins is a 23 y.o. female here for an annual pelvic exam and pap smear.   Review of Systems: Current GYN complaints or concerns: vaginal discharge, "bump" down there that hurts. Uncertain about last menstrual period. Desires birth control.  Patient denies any abdominal/pelvic pain, problems with bowel movements, urination, vaginal discharge or intercourse.   Past Medical History:  Diagnosis Date  . HIV disease (HCC) 06/2014  . Tachycardia 10/2014    Gynecologic History: G2P0202  Patient's last menstrual period was 01/28/2018 (approximate). Contraception: none Last Pap: not on file. Does not recall ever having one Anal Intercourse: no Last Mammogram: not indicated   Objective:  Physical Exam  Constitutional: Well developed, well nourished, no acute distress. She is alert and oriented x3.  Pelvic: External genitalia is normal in appearance with exception of single small eroded area noted on perineum just below introitus; clear fluid and pink wound bed. The vagina is normal in appearance. The cervix is bulbous and easily visualized but erythematous. No CMT, increased cervical mucus present that is yellow. Psych: She has a normal mood and affect.    Assessment:  Pelvic exam with increased cervical erythema and increased cervical thin mucopurulent drainage. No CMT. I suspect Chlamydia +/- gonorrhea. Was going to treat presumptively today however she had hospitalization in 2016 for fever/pancytopenia related to either cephalexin/ceftriaxone/azithromycin after being treated for STI. She does not recall much detail.  Thin prep pap was obtained and sent for cytology with reflex HPV and GC/C today including STI testing. If Chlamydia will treat with doxycycline. Send to allergy to see if can assist with cephalosporin allergy testing. Tolerated PCN in 2017 with syphilis treatment. She will have RPR today as well as HSV PCR testing of single lesion.   Plan:  Health  Maintenance =   Return in 1 year for annual pap screening unless indicated sooner. Discussed recommended screening interval for women living with HIV disease. Will continue annual screenings for now.   Results will be communicated to the patient via phone call.   She has been counseled and instructed how to perform monthly self breast exams.  Suspected STI / cervicitis =   Suspect chlamydia cervicitis. Tx with doxycycline as detailed above  Allergy testing to weigh in on listed cephalosporin allergy  Genital Ulceration = syphilitic chancre vs HSV vs LGV  RPR today   Viral swab   Treatment pending results   Contraception / Family Planning =   Urine pregnancy test negative.   Start depo injections today Q12 weeks.   Counseled on intervals for treatment and side effects.   HIV =   She will continue her odefsey and F/U as scheduled with Marcos EkeGreg Calone, NP for ongoing HIV care.   Rexene AlbertsStephanie Dixon, MSN, NP-C [facility] Office: [phone] Pager: [phone]  02/11/18 12:03 PM

## 2018-02-14 LAB — CBC WITH DIFFERENTIAL/PLATELET
ABSOLUTE MONOCYTES: 367 {cells}/uL (ref 200–950)
BASOS PCT: 0.6 %
Basophils Absolute: 31 cells/uL (ref 0–200)
EOS PCT: 0.6 %
Eosinophils Absolute: 31 cells/uL (ref 15–500)
HEMATOCRIT: 39 % (ref 35.0–45.0)
HEMOGLOBIN: 12.6 g/dL (ref 11.7–15.5)
LYMPHS ABS: 2576 {cells}/uL (ref 850–3900)
MCH: 25 pg — ABNORMAL LOW (ref 27.0–33.0)
MCHC: 32.3 g/dL (ref 32.0–36.0)
MCV: 77.5 fL — AB (ref 80.0–100.0)
MPV: 10.6 fL (ref 7.5–12.5)
Monocytes Relative: 7.2 %
NEUTROS PCT: 41.1 %
Neutro Abs: 2096 cells/uL (ref 1500–7800)
Platelets: 252 10*3/uL (ref 140–400)
RBC: 5.03 10*6/uL (ref 3.80–5.10)
RDW: 15.4 % — AB (ref 11.0–15.0)
Total Lymphocyte: 50.5 %
WBC: 5.1 10*3/uL (ref 3.8–10.8)

## 2018-02-14 LAB — HERPES SIMPLEX VIRUS CULTURE
MICRO NUMBER:: 39228
SPECIMEN QUALITY:: ADEQUATE

## 2018-02-14 LAB — COMPLETE METABOLIC PANEL WITH GFR
AG RATIO: 1.4 (calc) (ref 1.0–2.5)
ALBUMIN MSPROF: 4.4 g/dL (ref 3.6–5.1)
ALT: 9 U/L (ref 6–29)
AST: 14 U/L (ref 10–30)
Alkaline phosphatase (APISO): 81 U/L (ref 33–115)
BUN: 12 mg/dL (ref 7–25)
CALCIUM: 9.8 mg/dL (ref 8.6–10.2)
CO2: 28 mmol/L (ref 20–32)
CREATININE: 0.93 mg/dL (ref 0.50–1.10)
Chloride: 103 mmol/L (ref 98–110)
GFR, EST AFRICAN AMERICAN: 101 mL/min/{1.73_m2} (ref >=60)
GFR, Est Non African American: 87 mL/min/{1.73_m2} (ref >=60)
GLOBULIN: 3.2 g/dL (ref 1.9–3.7)
Glucose, Bld: 90 mg/dL (ref 65–99)
POTASSIUM: 3.8 mmol/L (ref 3.5–5.3)
SODIUM: 140 mmol/L (ref 135–146)
TOTAL PROTEIN: 7.6 g/dL (ref 6.1–8.1)
Total Bilirubin: 0.7 mg/dL (ref 0.2–1.2)

## 2018-02-14 LAB — HIV-1 RNA QUANT-NO REFLEX-BLD
HIV 1 RNA Quant: <20 copies/mL
HIV-1 RNA Quant, Log: <1.3 Log copies/mL

## 2018-02-14 NOTE — Progress Notes (Signed)
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 I will send in some valtrex to be taken twice a day for 7 days. If this recurs in a short time she can repeat this medication (will provide refills for her). It will crust up and dry up before heals completely. She is at risk of transmitting infection through sex so would not have sexual intercourse or any oral sex until this spot has crusted over completely.

## 2018-02-15 ENCOUNTER — Telehealth: Payer: Self-pay

## 2018-02-15 LAB — CYTOLOGY - PAP: DIAGNOSIS: NEGATIVE

## 2018-02-15 MED ORDER — VALACYCLOVIR HCL 1 G PO TABS: 1000.0000 mg | ORAL_TABLET | Freq: Two times a day (BID) | ORAL | 3 refills | Status: AC

## 2018-02-15 MED ORDER — METRONIDAZOLE 500 MG PO TABS: 2000.0000 mg | ORAL_TABLET | Freq: Once | ORAL | 0 refills | Status: AC

## 2018-02-15 NOTE — Addendum Note (Signed)
Addended by: Blanchard Kelch on: 02/15/2018 11:54 AM   Modules accepted: Orders

## 2018-02-15 NOTE — Addendum Note (Signed)
Addended by: Blanchard Kelch on: 02/15/2018 08:34 AM   Modules accepted: Orders

## 2018-02-15 NOTE — Telephone Encounter (Signed)
Called Alyzae  Left a message to call office back, will try again later today 02/15/2018.   Gerarda FractionGuadalupe C Lashena Signer, [place]

## 2018-02-15 NOTE — Telephone Encounter (Signed)
Judeth CornfieldStephanie had addiiantional information to give [name] in addition to being positive to herpes result.  Notes recorded by Blanchard Kelchixon, Stephanie N, NP on 02/15/2018 at 11:54 AM EST Also with trichomonas - will send in Rx for flagyl 2gm x 1 PO. Pap normal otherwise - repeat in 1 year.   *Please relay previous note as well if patient returns call.  Gerarda FractionGuadalupe C [place], [place]

## 2018-02-15 NOTE — Telephone Encounter (Signed)
-----   Message from Blanchard Kelch, NP sent at 02/14/2018  3:56 PM EST ----- Please give Sherry Robbins a call to let her know that the spot on her perineum she has was herpes. I will send in some valtrex to be taken twice a day for 7 days. If this recurs in a short time she can repeat this medication (will provide refills for her). It will crust up and dry up before heals completely. She is at risk of transmitting infection through sex so would not have sexual intercourse or any oral sex until this spot has crusted over completely.

## 2018-02-16 NOTE — Telephone Encounter (Signed)
Called patient and left message to call office back.

## 2018-02-21 ENCOUNTER — Encounter: Payer: Self-pay | Admitting: Behavioral Health

## 2018-02-21 NOTE — Telephone Encounter (Signed)
Called patient, unable to leave voicemail.  Will try again later.  Also sent patient a My Chart message telling her to call the office.  Also called [facility] to see if Roselie  has picked up Valacyclovir and Metronidazole and they confirmed she picked them both up on 02/15/2018. Angeline Slim RN

## 2018-02-23 ENCOUNTER — Encounter: Payer: Medicaid Other | Admitting: Family

## 2018-02-23 NOTE — Telephone Encounter (Signed)
Glad she picked them up. Good things.

## 2018-03-06 ENCOUNTER — Other Ambulatory Visit: Payer: Self-pay | Admitting: Family

## 2018-03-06 DIAGNOSIS — B20 Human immunodeficiency virus [HIV] disease: Secondary | ICD-10-CM

## 2018-03-23 ENCOUNTER — Encounter: Payer: Self-pay | Admitting: Allergy

## 2018-03-23 ENCOUNTER — Ambulatory Visit (INDEPENDENT_AMBULATORY_CARE_PROVIDER_SITE_OTHER): Payer: Medicaid Other | Admitting: Allergy

## 2018-03-23 VITALS — BP 108/64 | HR 104 | Temp 98.9°F | Resp 16 | Ht 65.25 in | Wt 119.0 lb

## 2018-03-23 DIAGNOSIS — T50905D Adverse effect of unspecified drugs, medicaments and biological substances, subsequent encounter: Secondary | ICD-10-CM | POA: Diagnosis not present

## 2018-03-23 DIAGNOSIS — T50905A Adverse effect of unspecified drugs, medicaments and biological substances, initial encounter: Secondary | ICD-10-CM

## 2018-03-23 HISTORY — DX: Adverse effect of unspecified drugs, medicaments and biological substances, initial encounter: T50.905A

## 2018-03-23 NOTE — Progress Notes (Signed)
New Patient Note  RE: Sherry Robbins MRN: [id number] DOB: 04/07/1995 Date of Office Visit: 03/23/2018  Referring provider: Blanchard Robbins, Sherry N, NP Primary care provider: Patient, No Pcp Per  Chief Complaint: New Patient (Initial Visit) (allergic reaction to medication )  History of Present Illness: I had the pleasure of seeing Sherry Robbins for initial evaluation at the [facility] on 03/25/2018. She is a 23 y.o. female, who is referred here by Sherry Robbins(Sherry Dixon, NP) for the evaluation of drug allergies.  Drug reactions: Patient was hospitalized on 05/04/2014 for fever and low hemoglobin counts and leukopenia. HIV test during hospitalization was negative at that time but by 07/03/2014 she had positive HIV testing.   Before her admission she was treated with ceftriaxone 500mg , Zithromax 1000mg  and cephalexin 500mg  QID for an STD on 04/04/2014. She does not recall having any other symptoms to these drugs. Denies any erythema, pruritus, sloughing of the skin.   She was seen by heme/onc after discharge in May 2016 and conclusion was that the pancytopenia occurred due to all the above drugs given at the same time.   No other history of allergic reactions.   Currently being followed by Sherry for HIV and has been doing well.   BM biopsy in 2016:  Bone Marrow, Aspirate,Biopsy, and Clot, right iliac - SLIGHTLY HYPERCELLULAR BONE MARROW FOR AGE WITH TRILINEAGE HEMATOPOIESIS. - SEE COMMENT. PERIPHERAL BLOOD: - PANCYTOPENIA. (ADDENDUM: Cytogenetics were normal: Diagnosis Note The bone marrow is slightly hypercellular with trilineage hematopoiesis and variable dyspoietic changes involving myeloid cell lines. In this age group, these features are not considered specific and may be related to infection, nutritional deficiency, medication, autoimmune disease, etc. The peripheral blood shows pancytopenia with associated abnormal red cell morphology including numerous microspherocytes.  This is most suggestive of a hemolytic process.  Assessment and Plan: Sherry Robbins is a 23 y.o. female with: Adverse drug reaction Referred by infectious disease regarding allergy listed to Ceftriaxone, Zithromax and Cephalexin. Patient was treated with ceftriaxone 500mg , Zithromax 1000mg  and cephalexin 500mg  QID for an STD on 04/04/2014. On 05/04/2014 she went to the hospital due to fevers and fatigue. Found to have leukopenia and anemia. HIV screen was negative at that time and was positive by 07/03/2014. Evaluated by heme/onc in 2016 when she still did not have HIV diagnosis.   Discussed with patient that I will need to review her records in more detail before deciding whether to do a drug challenge and to which one.  There is no indication for any type of allergy skin testing to these drugs as she did not have a typical IgE mediated hypersensitivity. She denies having any mucosal involvement, erythema, pruritus or sloughing of the skin. No elevated eosinophils on bloodwork either during this time.   Even if we did the drug challenge one drug at a time, there is still no guarantee that she won't have the same issue if given all 3 drugs at the same time in the future.   Continue to avoid Zithromax, cephalexin and ceftriaxone for now.  Will reach out to infectious disease about how to proceed and if the risks outweigh the benefits of undergoing a drug challenge.    Return for Possible Drug challenge.  Other allergy screening: Asthma: no Rhino conjunctivitis: no Food allergy: no Medication allergy: yes Hymenoptera allergy: no Urticaria: no Eczema:no History of recurrent infections suggestive of immunodeficency: no but she does have HIV.  Diagnostics: None.  Past Medical History: Patient Active  Problem List   Diagnosis Date Noted  . Adverse drug reaction 03/23/2018  . Preterm premature rupture of membranes (PPROM) with unknown onset of labor 01/04/2016  . Unsure of LMP (last menstrual  period) as reason for ultrasound scan 12/23/2015  . HIV disease (HCC) 03/27/2015  . Preterm labor 03/27/2015  . Anemia 05/05/2014   Past Medical History:  Diagnosis Date  . HIV disease (HCC) 06/2014  . Tachycardia 10/2014   Past Surgical History: Past Surgical History:  Procedure Laterality Date  . NO PAST SURGERIES     Medication List:  Current Outpatient Medications  Medication Sig Dispense Refill  . ODEFSEY 200-25-25 MG TABS tablet TAKE 1 TABLET BY MOUTH DAILY WITH BREAKFAST 30 tablet 5   No current facility-administered medications for this visit.    Allergies: Allergies  Allergen Reactions  . Ceftriaxone Other (See Comments)    Reaction:  Fever and cytopenia  . Cephalexin Other (See Comments)    Reaction:  Fever and cytopenia  . Zithromax [Azithromycin] Other (See Comments)    Reaction:  Fever and cytopenia   Social History: Social History   Socioeconomic History  . Marital status: Single    Spouse name: Not on file  . Number of children: Not on file  . Years of education: Not on file  . Highest education level: Not on file  Occupational History  . Not on file  Social Needs  . Financial resource strain: Not on file  . Food insecurity:    Worry: Not on file    Inability: Not on file  . Transportation needs:    Medical: Not on file    Non-medical: Not on file  Tobacco Use  . Smoking status: Current Some Day Smoker    Types: Cigars    Last attempt to quit: 08/30/2014    Years since quitting: 3.5  . Smokeless tobacco: Never Used  Substance and Sexual Activity  . Alcohol use: Yes  . Drug use: No  . Sexual activity: Yes    Partners: Male    Birth control/protection: Condom  Lifestyle  . Physical activity:    Days per week: Not on file    Minutes per session: Not on file  . Stress: Not on file  Relationships  . Social connections:    Talks on phone: Not on file    Gets together: Not on file    Attends religious service: Not on file    Active member  of club or organization: Not on file    Attends meetings of clubs or organizations: Not on file    Relationship status: Not on file  Other Topics Concern  . Not on file  Social History Narrative  . Not on file   Lives in an apartment. Smoking: sometimes smokes less than 1 cig per day Occupation: unemployed  Environmental History: Immunologist in the house: no Carpet in the family room: no Carpet in the bedroom: no Heating: electric Cooling: central Pet: no  Family History: Family History  Problem Relation Age of Onset  . Cancer Maternal Grandmother   . Allergic rhinitis Mother   . Food Allergy Mother   . Asthma Neg Hx   . Eczema Neg Hx   . Urticaria Neg Hx    Review of Systems  Constitutional: Negative for appetite change, chills, fever and unexpected weight change.  HENT: Negative for congestion and rhinorrhea.   Eyes: Negative for itching.  Respiratory: Negative for cough, chest tightness, shortness of breath and wheezing.  Cardiovascular: Negative for chest pain.  Gastrointestinal: Negative for abdominal pain.  Genitourinary: Negative for difficulty urinating.  Skin: Negative for rash.  Allergic/Immunologic: Negative for environmental allergies and food allergies.  Neurological: Negative for headaches.   Objective: BP 108/64 (BP Location: Left Arm, Patient Position: Sitting, Cuff Size: Normal)   Pulse (!) 104   Temp 98.9 F (37.2 C) (Oral)   Resp 16   Ht 5' 5.25" (1.657 m)   Wt 119 lb (54 kg)   SpO2 97%   BMI 19.65 kg/m  Body mass index is 19.65 kg/m. Physical Exam  Constitutional: She is oriented to person, place, and time. She appears well-developed and well-nourished.  HENT:  Head: Normocephalic and atraumatic.  Right Ear: External ear normal.  Left Ear: External ear normal.  Nose: Nose normal.  Mouth/Throat: Oropharynx is clear and moist.  Eyes: Conjunctivae and EOM are normal.  Neck: Neck supple.  Cardiovascular: Normal rate, regular  rhythm and normal heart sounds. Exam reveals no gallop and no friction rub.  No murmur heard. Pulmonary/Chest: Effort normal and breath sounds normal. She has no wheezes. She has no rales.  Abdominal: Soft.  Lymphadenopathy:    She has no cervical adenopathy.  Neurological: She is alert and oriented to person, place, and time.  Skin: Skin is warm. No rash noted.  Psychiatric: She has a normal mood and affect. Her behavior is normal.  Nursing note and vitals reviewed.  The plan was reviewed with the patient/family, and all questions/concerned were addressed.  It was my pleasure to see Kathlen today and participate in her care. Please feel free to contact me with any questions or concerns.  Sincerely,  Wyline Mood, DO Allergy & Immunology  [facility] office: [phone] [facility] office: [phone]

## 2018-03-23 NOTE — Patient Instructions (Signed)
I will review your hospital admission from 2016 and will send in a prescription to do a drug challenge.  Pick up medication 1 day before the drug challenge. No fevers, rash or allergy medications 3-5 days before drug challenge.  Continue to avoid Zithromax, cephalexin and ceftriaxone.  Follow up for drug challenge.

## 2018-03-25 NOTE — Assessment & Plan Note (Signed)
Referred by infectious disease regarding allergy listed to Ceftriaxone, Zithromax and Cephalexin. Patient was treated with ceftriaxone 500mg , Zithromax 1000mg  and cephalexin 500mg  QID for an STD on 04/04/2014. On 05/04/2014 she went to the hospital due to fevers and fatigue. Found to have leukopenia and anemia. HIV screen was negative at that time and was positive by 07/03/2014. Evaluated by heme/onc in 2016 when she still did not have HIV diagnosis.   Discussed with patient that I will need to review her records in more detail before deciding whether to do a drug challenge and to which one.  There is no indication for any type of allergy skin testing to these drugs as she did not have a typical IgE mediated hypersensitivity. She denies having any mucosal involvement, erythema, pruritus or sloughing of the skin. No elevated eosinophils on bloodwork either during this time.   Even if we did the drug challenge one drug at a time, there is still no guarantee that she won't have the same issue if given all 3 drugs at the same time in the future.   Continue to avoid Zithromax, cephalexin and ceftriaxone for now.  Will reach out to infectious disease about how to proceed and if the risks outweigh the benefits of undergoing a drug challenge.

## 2018-04-15 ENCOUNTER — Other Ambulatory Visit: Payer: Self-pay | Admitting: *Deleted

## 2018-04-15 DIAGNOSIS — B20 Human immunodeficiency virus [HIV] disease: Secondary | ICD-10-CM

## 2018-04-19 ENCOUNTER — Other Ambulatory Visit: Payer: Self-pay

## 2018-04-19 ENCOUNTER — Other Ambulatory Visit: Payer: Medicaid Other

## 2018-04-19 DIAGNOSIS — B20 Human immunodeficiency virus [HIV] disease: Secondary | ICD-10-CM

## 2018-04-20 LAB — T-HELPER CELL (CD4) - (RCID CLINIC ONLY)
CD4 T CELL HELPER: 40 % (ref 33–55)
CD4 T Cell Abs: 920 /uL (ref 400–2700)

## 2018-04-21 LAB — COMPLETE METABOLIC PANEL WITH GFR
AG Ratio: 1.5 (calc) (ref 1.0–2.5)
ALT: 24 U/L (ref 6–29)
AST: 17 U/L (ref 10–30)
Albumin: 4.2 g/dL (ref 3.6–5.1)
Alkaline phosphatase (APISO): 55 U/L (ref 31–125)
BUN: 9 mg/dL (ref 7–25)
CO2: 24 mmol/L (ref 20–32)
Calcium: 9.4 mg/dL (ref 8.6–10.2)
Chloride: 106 mmol/L (ref 98–110)
Creat: 0.83 mg/dL (ref 0.50–1.10)
GFR, Est African American: 115 mL/min/{1.73_m2} (ref >=60)
GFR, Est Non African American: 99 mL/min/{1.73_m2} (ref >=60)
GLUCOSE: 90 mg/dL (ref 65–99)
Globulin: 2.8 g/dL (calc) (ref 1.9–3.7)
Potassium: 3.8 mmol/L (ref 3.5–5.3)
Sodium: 140 mmol/L (ref 135–146)
Total Bilirubin: 0.6 mg/dL (ref 0.2–1.2)
Total Protein: 7 g/dL (ref 6.1–8.1)

## 2018-04-21 LAB — CBC WITH DIFFERENTIAL/PLATELET
Absolute Monocytes: 408 cells/uL (ref 200–950)
BASOS ABS: 19 {cells}/uL (ref 0–200)
Basophils Relative: 0.4 %
EOS PCT: 2.3 %
Eosinophils Absolute: 110 cells/uL (ref 15–500)
HCT: 37 % (ref 35.0–45.0)
Hemoglobin: 12.3 g/dL (ref 11.7–15.5)
Lymphs Abs: 2232 cells/uL (ref 850–3900)
MCH: 26 pg — ABNORMAL LOW (ref 27.0–33.0)
MCHC: 33.2 g/dL (ref 32.0–36.0)
MCV: 78.2 fL — ABNORMAL LOW (ref 80.0–100.0)
MPV: 10.7 fL (ref 7.5–12.5)
Monocytes Relative: 8.5 %
Neutro Abs: 2030 cells/uL (ref 1500–7800)
Neutrophils Relative %: 42.3 %
Platelets: 225 10*3/uL (ref 140–400)
RBC: 4.73 10*6/uL (ref 3.80–5.10)
RDW: 15.2 % — ABNORMAL HIGH (ref 11.0–15.0)
Total Lymphocyte: 46.5 %
WBC: 4.8 10*3/uL (ref 3.8–10.8)

## 2018-04-21 LAB — HIV-1 RNA QUANT-NO REFLEX-BLD
HIV 1 RNA Quant: <20 copies/mL — AB
HIV-1 RNA Quant, Log: <1.3 Log copies/mL — AB

## 2018-04-25 ENCOUNTER — Encounter: Payer: Medicaid Other | Admitting: Allergy

## 2018-05-03 ENCOUNTER — Ambulatory Visit (INDEPENDENT_AMBULATORY_CARE_PROVIDER_SITE_OTHER): Payer: Medicaid Other | Admitting: Infectious Diseases

## 2018-05-03 ENCOUNTER — Other Ambulatory Visit: Payer: Self-pay

## 2018-05-03 ENCOUNTER — Encounter: Payer: Self-pay | Admitting: Infectious Diseases

## 2018-05-03 VITALS — BP 116/74 | HR 82 | Temp 99.2°F | Ht 65.0 in | Wt 126.0 lb

## 2018-05-03 DIAGNOSIS — B20 Human immunodeficiency virus [HIV] disease: Secondary | ICD-10-CM | POA: Diagnosis not present

## 2018-05-03 DIAGNOSIS — Z3042 Encounter for surveillance of injectable contraceptive: Secondary | ICD-10-CM

## 2018-05-03 LAB — POCT URINE PREGNANCY: Preg Test, Ur: NEGATIVE

## 2018-05-03 MED ORDER — MEDROXYPROGESTERONE ACETATE 150 MG/ML IM SUSP
150.0000 mg | Freq: Once | INTRAMUSCULAR | Status: AC
  Administered 2018-05-03: 150 mg via INTRAMUSCULAR

## 2018-05-03 NOTE — Patient Instructions (Signed)
Continue your medications - they are doing well!  Please return in 6 months with labs before your visit.

## 2018-05-03 NOTE — Assessment & Plan Note (Addendum)
Her viral load continues to be suppressed with healthy CD4 count in the 900 range.  She is doing very well.  We will continue her Odefsey once daily for her.  She can return again in 6 months with labs prior to her visit.  She was provided with condoms today.  We will check an RPR and urine for STDs next office visit.  Reviewed vaccination history with counted today, second dose of meningococcal next office visit.

## 2018-05-03 NOTE — Progress Notes (Signed)
Name: Sherry Robbins  DOB: [birth year] MRN: [id number] PCP: Patient, No Pcp Per    Patient Active Problem List   Diagnosis Date Noted  . Encounter for management and injection of injectable progestin contraceptive 05/03/2018  . Adverse drug reaction 03/23/2018  . HIV disease (HCC) 03/27/2015  . Anemia 05/05/2014     Subjective:   Chief Complaint  Patient presents with  . Follow-up    doing well, allergist appointment in June     HPI:  Sherry Robbins continues to do very well with her HIV medication.  She reports no missed doses and 100% adherence to her Biktarvy.  She has no concerns over side effects, access to her medicine.  She has been home with her 2 young boys feeling very well.  No interval illnesses since last office visit.  She is due for her Depo injection today, is 5 days late from her cycle.  She completed her course of Valtrex for genital herpes, and metronidazole for her trichomonas.  All symptoms have resolved.  Denies any sexual activity since last office visit.  She tells me she saw the allergy team and is planning a drug challenge to help debulk some of her listed drug allergies from an incident that happened in 2016.  Depression screen [facility] 2/9 05/03/2018  Decreased Interest 0  Down, Depressed, Hopeless 0  PHQ - 2 Score 0    Review of Systems  Constitutional: Negative for chills, fever, malaise/fatigue and weight loss.  HENT: Negative for sore throat.        No dental problems  Respiratory: Negative for cough and sputum production.   Cardiovascular: Negative for chest pain and leg swelling.  Gastrointestinal: Negative for abdominal pain, diarrhea and vomiting.  Genitourinary: Negative for dysuria and flank pain.  Musculoskeletal: Negative for joint pain, myalgias and neck pain.  Skin: Negative for rash.  Neurological: Negative for dizziness, tingling and headaches.  Psychiatric/Behavioral: Negative for depression and substance abuse. The patient is not  nervous/anxious and does not have insomnia.     Past Medical History:  Diagnosis Date  . HIV disease (HCC) 06/2014  . Tachycardia 10/2014    Outpatient Medications Prior to Visit  Medication Sig Dispense Refill  . ODEFSEY 200-25-25 MG TABS tablet TAKE 1 TABLET BY MOUTH DAILY WITH BREAKFAST 30 tablet 5   No facility-administered medications prior to visit.      Allergies  Allergen Reactions  . Ceftriaxone Other (See Comments)    Reaction:  Fever and cytopenia  . Cephalexin Other (See Comments)    Reaction:  Fever and cytopenia  . Zithromax [Azithromycin] Other (See Comments)    Reaction:  Fever and cytopenia    Social History   Tobacco Use  . Smoking status: Current Some Day Smoker    Types: Cigars    Last attempt to quit: 08/30/2014    Years since quitting: 3.6  . Smokeless tobacco: Never Used  Substance Use Topics  . Alcohol use: Yes  . Drug use: No    Family History  Problem Relation Age of Onset  . Cancer Maternal Grandmother   . Allergic rhinitis Mother   . Food Allergy Mother   . Asthma Neg Hx   . Eczema Neg Hx   . Urticaria Neg Hx     Social History   Substance and Sexual Activity  Sexual Activity Yes  . Partners: Male  . Birth control/protection: Condom     Objective:   Vitals:   05/03/18 1000  BP:  116/74  Pulse: 82  Temp: 99.2 F (37.3 C)  TempSrc: Oral  Weight: 126 lb (57.2 kg)  Height: 5\' 5"  (1.651 m)   Body mass index is 20.97 kg/m.  Physical Exam Vitals signs reviewed.  Constitutional:      Appearance: She is well-developed.     Comments: Seated comfortably in chair.   HENT:     Mouth/Throat:     Mouth: No oral lesions.     Dentition: Normal dentition. No dental abscesses.     Pharynx: No oropharyngeal exudate.  Cardiovascular:     Rate and Rhythm: Normal rate and regular rhythm.     Heart sounds: Normal heart sounds.  Pulmonary:     Effort: Pulmonary effort is normal.     Breath sounds: Normal breath sounds.   Abdominal:     General: There is no distension.     Palpations: Abdomen is soft.     Tenderness: There is no abdominal tenderness.  Lymphadenopathy:     Cervical: No cervical adenopathy.  Skin:    General: Skin is warm and dry.     Findings: No rash.  Neurological:     Mental Status: She is alert and oriented to person, place, and time.  Psychiatric:        Judgment: Judgment normal.     Comments: In good spirits today and engaged in care discussion     Lab Results Lab Results  Component Value Date   WBC 4.8 04/19/2018   HGB 12.3 04/19/2018   HCT 37.0 04/19/2018   MCV 78.2 (L) 04/19/2018   PLT 225 04/19/2018    Lab Results  Component Value Date   CREATININE 0.83 04/19/2018   BUN 9 04/19/2018   NA 140 04/19/2018   K 3.8 04/19/2018   CL 106 04/19/2018   CO2 24 04/19/2018    Lab Results  Component Value Date   ALT 24 04/19/2018   AST 17 04/19/2018   ALKPHOS 48 06/05/2014   BILITOT 0.6 04/19/2018    Lab Results  Component Value Date   CHOL 233 (H) 08/26/2017   HDL 55 08/26/2017   LDLCALC 147 (H) 08/26/2017   TRIG 177 (H) 08/26/2017   CHOLHDL 4.2 08/26/2017   HIV 1 RNA Quant (copies/mL)  Date Value  04/19/2018 <20 DETECTED (A)  02/11/2018 <20 NOT DETECTED  08/26/2017 25 (H)   CD4 T Cell Abs (/uL)  Date Value  04/19/2018 920  02/11/2018 1,080  08/26/2017 550     Assessment & Plan:   Problem List Items Addressed This Visit      Unprioritized   HIV disease (HCC)    Her viral load continues to be suppressed with healthy CD4 count in the 900 range.  She is doing very well.  We will continue her Odefsey once daily for her.  She can return again in 6 months with labs prior to her visit.  She was provided with condoms today.  We will check an RPR and urine for STDs next office visit.  Reviewed vaccination history with counted today, second dose of meningococcal next office visit.       Relevant Medications   medroxyPROGESTERone (DEPO-PROVERA) injection  150 mg (Completed)   Other Relevant Orders   HIV-1 RNA quant-no reflex-bld   RPR   Encounter for management and injection of injectable progestin contraceptive    We will check urine pregnancy test today and provide Depo injection.  It is possible that she will not continue to have menses  while on injections.  Discussed recommendations about scheduling every 12 weeks for injection and not to delay beyond 14 weeks.       Relevant Medications   medroxyPROGESTERone (DEPO-PROVERA) injection 150 mg (Completed)    Other Visit Diagnoses    Encounter for management and injection of depo-Provera    -  Primary   Relevant Medications   medroxyPROGESTERone (DEPO-PROVERA) injection 150 mg (Completed)   Other Relevant Orders   POCT urine pregnancy (Completed)     Return in about 6 months (around 11/02/2018).  Rexene Alberts, MSN, NP-C [facility] for Infectious Disease [facility] Pager: [phone] Office: [phone]  05/03/18

## 2018-05-03 NOTE — Assessment & Plan Note (Signed)
We will check urine pregnancy test today and provide Depo injection.  It is possible that she will not continue to have menses while on injections.  Discussed recommendations about scheduling every 12 weeks for injection and not to delay beyond 14 weeks.

## 2018-07-04 ENCOUNTER — Encounter: Payer: Medicaid Other | Admitting: Allergy

## 2018-08-30 ENCOUNTER — Other Ambulatory Visit: Payer: Self-pay | Admitting: Family

## 2018-08-30 DIAGNOSIS — B20 Human immunodeficiency virus [HIV] disease: Secondary | ICD-10-CM

## 2018-10-04 DIAGNOSIS — F40241 Acrophobia: Secondary | ICD-10-CM | POA: Diagnosis not present

## 2018-10-04 DIAGNOSIS — B20 Human immunodeficiency virus [HIV] disease: Secondary | ICD-10-CM | POA: Diagnosis not present

## 2018-11-03 ENCOUNTER — Other Ambulatory Visit: Payer: Medicaid Other

## 2018-11-17 ENCOUNTER — Encounter: Payer: Medicaid Other | Admitting: Infectious Diseases

## 2018-12-07 ENCOUNTER — Other Ambulatory Visit: Payer: Medicaid Other

## 2018-12-07 ENCOUNTER — Other Ambulatory Visit: Payer: Self-pay

## 2018-12-07 DIAGNOSIS — B20 Human immunodeficiency virus [HIV] disease: Secondary | ICD-10-CM

## 2018-12-07 DIAGNOSIS — Z3490 Encounter for supervision of normal pregnancy, unspecified, unspecified trimester: Secondary | ICD-10-CM | POA: Diagnosis not present

## 2018-12-13 LAB — HIV-1 RNA QUANT-NO REFLEX-BLD
HIV 1 RNA Quant: <20 copies/mL
HIV-1 RNA Quant, Log: <1.3 Log copies/mL

## 2018-12-13 LAB — RPR: RPR Ser Ql: NONREACTIVE

## 2018-12-20 DIAGNOSIS — Z0000 Encounter for general adult medical examination without abnormal findings: Secondary | ICD-10-CM | POA: Diagnosis not present

## 2018-12-21 ENCOUNTER — Ambulatory Visit (INDEPENDENT_AMBULATORY_CARE_PROVIDER_SITE_OTHER): Payer: Medicaid Other | Admitting: Infectious Diseases

## 2018-12-21 ENCOUNTER — Other Ambulatory Visit: Payer: Self-pay

## 2018-12-21 ENCOUNTER — Encounter: Payer: Self-pay | Admitting: Infectious Diseases

## 2018-12-21 DIAGNOSIS — Z23 Encounter for immunization: Secondary | ICD-10-CM | POA: Diagnosis not present

## 2018-12-21 DIAGNOSIS — O98912 Unspecified maternal infectious and parasitic disease complicating pregnancy, second trimester: Secondary | ICD-10-CM | POA: Diagnosis not present

## 2018-12-21 DIAGNOSIS — R7989 Other specified abnormal findings of blood chemistry: Secondary | ICD-10-CM | POA: Diagnosis not present

## 2018-12-21 DIAGNOSIS — B20 Human immunodeficiency virus [HIV] disease: Secondary | ICD-10-CM

## 2018-12-21 DIAGNOSIS — R978 Other abnormal tumor markers: Secondary | ICD-10-CM | POA: Diagnosis not present

## 2018-12-21 DIAGNOSIS — O98913 Unspecified maternal infectious and parasitic disease complicating pregnancy, third trimester: Secondary | ICD-10-CM | POA: Insufficient documentation

## 2018-12-21 DIAGNOSIS — Z0000 Encounter for general adult medical examination without abnormal findings: Secondary | ICD-10-CM | POA: Diagnosis not present

## 2018-12-21 MED ORDER — EMTRICITABINE-TENOFOVIR DF 200-300 MG PO TABS: 1.0000 | ORAL_TABLET | Freq: Every day | ORAL | 11 refills | Status: DC

## 2018-12-21 MED ORDER — DOLUTEGRAVIR SODIUM 50 MG PO TABS: 50.0000 mg | ORAL_TABLET | Freq: Every day | ORAL | 11 refills | Status: DC

## 2018-12-21 NOTE — Assessment & Plan Note (Signed)
She is approaching her second trimester.  We will switch to Tivicay and Truvada as described below.  She is taking a prenatal vitamin and is using Diclegis for nausea relief.  Welcomed and answered all questions regarding HIV in pregnancy.  I informed her that she can have a normal vaginal delivery as long as she keeps her medications consistent and viral load suppressed.  She is signing up for [facility] tomorrow and plans to use formula as she is aware of the recommendations to not breast-feed. She has an OB/GYN appointment coming up soon.  Needs an updated Pap with STI screening.  Syphilis screen was recently negative.

## 2018-12-21 NOTE — Assessment & Plan Note (Signed)
Sherry Robbins has very well-controlled HIV disease on her current regimen of Odefsey.  While this is typically a safe regimen early in pregnancy regarding teratogenic profile, explained to her that I would like to switch her to a better studied regimen that will more durably and reliably keep her viral load under good control as she progresses to her second and third trimester.  I will switch to Tivicay + Truvada once a day.  She will not need new dose adjustments as she progresses through trimesters.  Counseled on side effects.  We will have her back in 1 month to repeat blood work and see her back in the office 3 weeks months after that for routine monitoring.

## 2018-12-21 NOTE — Progress Notes (Signed)
Name: Sherry Robbins  DOB: 1995-08-10 MRN: [id number] PCP: Patient, No Pcp Per    Patient Active Problem List   Diagnosis Date Noted  . Pregnancy and infectious disease in second trimester 12/21/2018  . Encounter for management and injection of injectable progestin contraceptive 05/03/2018  . Adverse drug reaction 03/23/2018  . HIV disease ([place]) 03/27/2015  . Anemia 05/05/2014     Subjective:  CC: HIV follow up care.  Newly pregnant (3 months). Nausea.  HPI:  Sherry Robbins is here today for HIV follow-up care.  She tells me that she recently found out she is pregnant with her third child.  She is about [redacted] weeks pregnant with a due date of Jun 24, 2019.  She has not yet been seen by obstetrics but has an appointment coming up soon with [facility] OB/GYN team.  She has had a lot of nausea but has been using Diclegis, pretty pops and has figured out what foods to avoid.  She has been able to get her [facility] and every day with food as instructed.   Review of Systems  Constitutional: Negative for chills, fever, malaise/fatigue and weight loss.  HENT: Negative for sore throat.        No dental problems  Respiratory: Negative for cough and sputum production.   Cardiovascular: Negative for chest pain and leg swelling.  Gastrointestinal: Negative for abdominal pain, diarrhea and vomiting.  Genitourinary: Negative for dysuria and flank pain.  Musculoskeletal: Negative for joint pain, myalgias and neck pain.  Skin: Negative for rash.  Neurological: Negative for dizziness, tingling and headaches.  Psychiatric/Behavioral: Negative for depression and substance abuse. The patient is not nervous/anxious and does not have insomnia.     Past Medical History:  Diagnosis Date  . HIV disease ([place]) 06/2014  . Tachycardia 10/2014    Outpatient Medications Prior to Visit  Medication Sig Dispense Refill  . ODEFSEY 200-25-25 MG TABS tablet TAKE 1 TABLET BY MOUTH DAILY WITH BREAKFAST 30 tablet 5   No  facility-administered medications prior to visit.      Allergies  Allergen Reactions  . Ceftriaxone Other (See Comments)    Reaction:  Fever and cytopenia  . Cephalexin Other (See Comments)    Reaction:  Fever and cytopenia  . Zithromax [Azithromycin] Other (See Comments)    Reaction:  Fever and cytopenia    Social History   Tobacco Use  . Smoking status: Former Smoker    Types: Cigars    Quit date: 08/30/2014    Years since quitting: 4.3  . Smokeless tobacco: Never Used  Substance Use Topics  . Alcohol use: Not Currently  . Drug use: No    Social History   Substance and Sexual Activity  Sexual Activity Yes  . Partners: Male  . Birth control/protection: Condom   Comment: declined condoms 12/2018     Objective:   Vitals:   12/21/18 1616  BP: 104/71  Pulse: (!) 108  Temp: 97.9 F (36.6 C)  TempSrc: Oral  Weight: 128 lb (58.1 kg)   Body mass index is 21.3 kg/m.   Physical Exam Vitals signs reviewed.  Constitutional:      Appearance: She is well-developed.     Comments: Seated comfortably in chair.   HENT:     Mouth/Throat:     Mouth: No oral lesions.     Dentition: Normal dentition. No dental abscesses.     Pharynx: No oropharyngeal exudate.  Cardiovascular:     Rate and Rhythm: Normal rate  and regular rhythm.     Heart sounds: Normal heart sounds.  Pulmonary:     Effort: Pulmonary effort is normal.     Breath sounds: Normal breath sounds.  Abdominal:     General: There is no distension.     Palpations: Abdomen is soft.     Tenderness: There is no abdominal tenderness.  Lymphadenopathy:     Cervical: No cervical adenopathy.  Skin:    General: Skin is warm and dry.     Findings: No rash.  Neurological:     Mental Status: She is alert and oriented to person, place, and time.  Psychiatric:        Judgment: Judgment normal.     Comments: In good spirits today. Quiet     Lab Results Lab Results  Component Value Date   WBC 4.8 04/19/2018    HGB 12.3 04/19/2018   HCT 37.0 04/19/2018   MCV 78.2 (L) 04/19/2018   PLT 225 04/19/2018    Lab Results  Component Value Date   CREATININE 0.83 04/19/2018   BUN 9 04/19/2018   NA 140 04/19/2018   K 3.8 04/19/2018   CL 106 04/19/2018   CO2 24 04/19/2018    Lab Results  Component Value Date   ALT 24 04/19/2018   AST 17 04/19/2018   ALKPHOS 48 06/05/2014   BILITOT 0.6 04/19/2018    Lab Results  Component Value Date   CHOL 233 (H) 08/26/2017   HDL 55 08/26/2017   LDLCALC 147 (H) 08/26/2017   TRIG 177 (H) 08/26/2017   CHOLHDL 4.2 08/26/2017   HIV 1 RNA Quant (copies/mL)  Date Value  12/07/2018 <20 NOT DETECTED  04/19/2018 <20 DETECTED (A)  02/11/2018 <20 NOT DETECTED   CD4 T Cell Abs (/uL)  Date Value  04/19/2018 920  02/11/2018 1,080  08/26/2017 550     Assessment & Plan:   Problem List Items Addressed This Visit      Unprioritized   Pregnancy and infectious disease in second trimester    She is approaching her second trimester.  We will switch to Tivicay and Truvada as described below.  She is taking a prenatal vitamin and is using Diclegis for nausea relief.  Welcomed and answered all questions regarding HIV in pregnancy.  I informed her that she can have a normal vaginal delivery as long as she keeps her medications consistent and viral load suppressed.  [facility] and plans to use formula as she is aware of the recommendations to not breast-feed. She has an OB/GYN appointment coming up soon.  Needs an updated Pap with STI screening.  Syphilis screen was recently negative.      Relevant Medications   dolutegravir (TIVICAY) 50 MG tablet   emtricitabine-tenofovir (TRUVADA) 200-300 MG tablet   HIV disease (HCC)    [name] has very well-controlled HIV disease on her current regimen of Odefsey.  While this is typically a safe regimen early in pregnancy regarding teratogenic profile, explained to her that I would like to switch her to a better  studied regimen that will more durably and reliably keep her viral load under good control as she progresses to her second and third trimester.  I will switch to Tivicay + Truvada once a day.  She will not need new dose adjustments as she progresses through trimesters.  Counseled on side effects.  We will have her back in 1 month to repeat blood work and see her back in  the office 3 weeks months after that for routine monitoring.      Relevant Medications   dolutegravir (TIVICAY) 50 MG tablet   emtricitabine-tenofovir (TRUVADA) 200-300 MG tablet    Other Visit Diagnoses    Need for immunization against influenza       Relevant Orders   Flu Vaccine QUAD 36+ mos IM (Completed)     Return in about 3 months (around 03/23/2019).  Rexene Alberts, MSN, NP-C [facility] for Infectious Disease [facility] Pager: [phone] Office: [phone]  12/21/18

## 2018-12-21 NOTE — Patient Instructions (Signed)
Congratulations! Nice to see you as always.   For your treatment I do want to switch your medication temporarily to Tivicay + Truvada. Stop your Vernell Leep once you pick these up.   I am confident that you will tolerate these perfectly without trouble. You can continue taking them with food if you like to help with any nausea you may have.   Please come back in 1 month to recheck your viral load.   Please come back for an office visit in 3 months - no labs before are necessary.

## 2019-01-20 ENCOUNTER — Other Ambulatory Visit: Payer: Medicaid Other

## 2019-01-26 DIAGNOSIS — Z3482 Encounter for supervision of other normal pregnancy, second trimester: Secondary | ICD-10-CM | POA: Diagnosis not present

## 2019-01-26 DIAGNOSIS — Z113 Encounter for screening for infections with a predominantly sexual mode of transmission: Secondary | ICD-10-CM | POA: Diagnosis not present

## 2019-01-26 DIAGNOSIS — Z87898 Personal history of other specified conditions: Secondary | ICD-10-CM | POA: Diagnosis not present

## 2019-01-26 DIAGNOSIS — Z87891 Personal history of nicotine dependence: Secondary | ICD-10-CM | POA: Diagnosis not present

## 2019-01-26 DIAGNOSIS — Z3480 Encounter for supervision of other normal pregnancy, unspecified trimester: Secondary | ICD-10-CM | POA: Diagnosis not present

## 2019-01-26 DIAGNOSIS — Z8751 Personal history of pre-term labor: Secondary | ICD-10-CM | POA: Diagnosis not present

## 2019-01-26 DIAGNOSIS — Z21 Asymptomatic human immunodeficiency virus [HIV] infection status: Secondary | ICD-10-CM | POA: Diagnosis not present

## 2019-02-26 ENCOUNTER — Other Ambulatory Visit: Payer: Self-pay | Admitting: Family

## 2019-02-26 DIAGNOSIS — B20 Human immunodeficiency virus [HIV] disease: Secondary | ICD-10-CM

## 2019-03-06 DIAGNOSIS — Z3482 Encounter for supervision of other normal pregnancy, second trimester: Secondary | ICD-10-CM | POA: Diagnosis not present

## 2019-03-06 DIAGNOSIS — O09212 Supervision of pregnancy with history of pre-term labor, second trimester: Secondary | ICD-10-CM | POA: Diagnosis not present

## 2019-03-06 DIAGNOSIS — Z3A24 24 weeks gestation of pregnancy: Secondary | ICD-10-CM | POA: Diagnosis not present

## 2019-03-06 DIAGNOSIS — Z3480 Encounter for supervision of other normal pregnancy, unspecified trimester: Secondary | ICD-10-CM | POA: Diagnosis not present

## 2019-03-06 DIAGNOSIS — O26842 Uterine size-date discrepancy, second trimester: Secondary | ICD-10-CM | POA: Diagnosis not present

## 2019-03-08 ENCOUNTER — Other Ambulatory Visit: Payer: Self-pay

## 2019-03-08 ENCOUNTER — Other Ambulatory Visit: Payer: Medicaid Other

## 2019-03-08 DIAGNOSIS — B20 Human immunodeficiency virus [HIV] disease: Secondary | ICD-10-CM

## 2019-03-23 ENCOUNTER — Encounter: Payer: Medicaid Other | Admitting: Infectious Diseases

## 2019-04-03 DIAGNOSIS — Z3480 Encounter for supervision of other normal pregnancy, unspecified trimester: Secondary | ICD-10-CM | POA: Diagnosis not present

## 2019-04-03 DIAGNOSIS — Z21 Asymptomatic human immunodeficiency virus [HIV] infection status: Secondary | ICD-10-CM | POA: Diagnosis not present

## 2019-04-03 DIAGNOSIS — Z8619 Personal history of other infectious and parasitic diseases: Secondary | ICD-10-CM | POA: Diagnosis not present

## 2019-04-03 DIAGNOSIS — Z23 Encounter for immunization: Secondary | ICD-10-CM | POA: Diagnosis not present

## 2019-04-03 DIAGNOSIS — Z3482 Encounter for supervision of other normal pregnancy, second trimester: Secondary | ICD-10-CM | POA: Diagnosis not present

## 2019-04-03 DIAGNOSIS — O0990 Supervision of high risk pregnancy, unspecified, unspecified trimester: Secondary | ICD-10-CM | POA: Diagnosis not present

## 2019-05-01 ENCOUNTER — Other Ambulatory Visit: Payer: Medicaid Other

## 2019-05-03 ENCOUNTER — Other Ambulatory Visit: Payer: Self-pay

## 2019-05-03 ENCOUNTER — Other Ambulatory Visit: Payer: Medicaid Other

## 2019-05-03 DIAGNOSIS — B20 Human immunodeficiency virus [HIV] disease: Secondary | ICD-10-CM

## 2019-05-04 LAB — T-HELPER CELL (CD4) - (RCID CLINIC ONLY)
CD4 % Helper T Cell: 44 % (ref 33–65)
CD4 T Cell Abs: 785 /uL (ref 400–1790)

## 2019-05-05 LAB — HIV-1 RNA QUANT-NO REFLEX-BLD
HIV 1 RNA Quant: <20 copies/mL
HIV-1 RNA Quant, Log: <1.3 Log copies/mL

## 2019-05-16 ENCOUNTER — Encounter: Payer: Medicaid Other | Admitting: Infectious Diseases

## 2019-05-23 ENCOUNTER — Encounter: Payer: Self-pay | Admitting: Infectious Diseases

## 2019-05-24 DIAGNOSIS — Z3685 Encounter for antenatal screening for Streptococcus B: Secondary | ICD-10-CM | POA: Diagnosis not present

## 2019-05-24 DIAGNOSIS — Z3480 Encounter for supervision of other normal pregnancy, unspecified trimester: Secondary | ICD-10-CM | POA: Diagnosis not present

## 2019-05-26 DIAGNOSIS — O365930 Maternal care for other known or suspected poor fetal growth, third trimester, not applicable or unspecified: Secondary | ICD-10-CM | POA: Diagnosis not present

## 2019-05-26 DIAGNOSIS — Z3A35 35 weeks gestation of pregnancy: Secondary | ICD-10-CM | POA: Diagnosis not present

## 2019-05-26 DIAGNOSIS — O09293 Supervision of pregnancy with other poor reproductive or obstetric history, third trimester: Secondary | ICD-10-CM | POA: Diagnosis not present

## 2019-06-02 DIAGNOSIS — O365930 Maternal care for other known or suspected poor fetal growth, third trimester, not applicable or unspecified: Secondary | ICD-10-CM | POA: Diagnosis not present

## 2019-06-02 DIAGNOSIS — Z3A36 36 weeks gestation of pregnancy: Secondary | ICD-10-CM | POA: Diagnosis not present

## 2019-06-08 DIAGNOSIS — Z01812 Encounter for preprocedural laboratory examination: Secondary | ICD-10-CM | POA: Diagnosis not present

## 2019-06-09 ENCOUNTER — Other Ambulatory Visit: Payer: Self-pay

## 2019-06-09 ENCOUNTER — Telehealth (INDEPENDENT_AMBULATORY_CARE_PROVIDER_SITE_OTHER): Payer: Medicaid Other | Admitting: Infectious Diseases

## 2019-06-09 ENCOUNTER — Encounter: Payer: Self-pay | Admitting: Infectious Diseases

## 2019-06-09 DIAGNOSIS — Z79899 Other long term (current) drug therapy: Secondary | ICD-10-CM

## 2019-06-09 DIAGNOSIS — Z21 Asymptomatic human immunodeficiency virus [HIV] infection status: Secondary | ICD-10-CM | POA: Diagnosis not present

## 2019-06-09 DIAGNOSIS — O365930 Maternal care for other known or suspected poor fetal growth, third trimester, not applicable or unspecified: Secondary | ICD-10-CM | POA: Diagnosis not present

## 2019-06-09 DIAGNOSIS — Z3A37 37 weeks gestation of pregnancy: Secondary | ICD-10-CM | POA: Diagnosis not present

## 2019-06-09 DIAGNOSIS — B20 Human immunodeficiency virus [HIV] disease: Secondary | ICD-10-CM

## 2019-06-09 DIAGNOSIS — O98913 Unspecified maternal infectious and parasitic disease complicating pregnancy, third trimester: Secondary | ICD-10-CM | POA: Diagnosis not present

## 2019-06-09 MED ORDER — ODEFSEY 200-25-25 MG PO TABS: 1.0000 | ORAL_TABLET | Freq: Every day | ORAL | 11 refills | Status: DC

## 2019-06-09 NOTE — Addendum Note (Signed)
Addended by: Blanchard Kelch on: 06/09/2019 09:28 AM   Modules accepted: Orders

## 2019-06-09 NOTE — Assessment & Plan Note (Signed)
Discussed intra and post partum care and advocating for bottle feeding, which she plans to do.  She would like to resume Depo injections - counseled on intervals for these and consistency to avoid lapses so she has reliable birth control. I asked her to discuss with her OB team regarding specifically the timing for her first shot post partum and offered we could continue these for her if she desires.  Will discuss again at our next OV

## 2019-06-09 NOTE — Assessment & Plan Note (Addendum)
Continues to have perfectly controlled HIV on Tivicay + Truvada. Will plan to send in new Rx for [facility] she can resume after she completes current bottles. Counseled to remember to take [facility] with a full chewable meal and avoid PPIs / Acid reducers.  Will have her back in 3-4 months for labs and a virtual appt.   Needs Hep B vaccines and others - will follow after pregnancy

## 2019-06-09 NOTE — Progress Notes (Signed)
Name: Sherry Robbins  DOB: 11/21/1995 MRN: [id number] PCP: Patient, No Pcp Per    Patient Active Problem List   Diagnosis Date Noted  . Pregnancy and infectious disease in third trimester 12/21/2018  . Encounter for management and injection of injectable progestin contraceptive 05/03/2018  . Adverse drug reaction 03/23/2018  . HIV disease (HCC) 03/27/2015  . Anemia 05/05/2014     Subjective:  CC: HIV follow up care - pregnant with EDD 06/11/2019 (induction planned)    HPI:  She is just shy of [redacted] weeks pregnant with planned labor induction on 5/09 for IUGR and low birth weight. She has been followed closely by MFM team and baby looks good developmentally, other than small.   Last viral load 3 weeks ago undetectable and CD4 healthy range at 785 on daily Tivicay + Truvada. She has tolerated these very well.  She would like to go back on [place] after birth if possible.   She desires birth control and is considering depo injections again. She has not discussed with OB team yet.  She plans to bottle feed.    Review of Systems  Constitutional: Negative for chills, fever, malaise/fatigue and weight loss.  HENT: Negative for sore throat.        No dental problems  Respiratory: Negative for cough and sputum production.   Cardiovascular: Negative for chest pain and leg swelling.  Gastrointestinal: Negative for abdominal pain, diarrhea and vomiting.  Genitourinary: Negative for dysuria and flank pain.  Musculoskeletal: Negative for joint pain, myalgias and neck pain.  Skin: Negative for rash.  Neurological: Negative for dizziness, tingling and headaches.  Psychiatric/Behavioral: Negative for depression and substance abuse. The patient is not nervous/anxious and does not have insomnia.     Past Medical History:  Diagnosis Date  . HIV disease (HCC) 06/2014  . Tachycardia 10/2014    Outpatient Medications Prior to Visit  Medication Sig Dispense Refill  . dolutegravir (TIVICAY) 50  MG tablet Take 1 tablet (50 mg total) by mouth daily. 30 tablet 11  . emtricitabine-tenofovir (TRUVADA) 200-300 MG tablet Take 1 tablet by mouth daily. 30 tablet 11   No facility-administered medications prior to visit.     Allergies  Allergen Reactions  . Ceftriaxone Other (See Comments)    Reaction:  Fever and cytopenia  . Cephalexin Other (See Comments)    Reaction:  Fever and cytopenia  . Zithromax [Azithromycin] Other (See Comments)    Reaction:  Fever and cytopenia    Social History   Tobacco Use  . Smoking status: Former Smoker    Types: Cigars    Quit date: 08/30/2014    Years since quitting: 4.7  . Smokeless tobacco: Never Used  Substance Use Topics  . Alcohol use: Not Currently  . Drug use: No    Social History   Substance and Sexual Activity  Sexual Activity Yes  . Partners: Male  . Birth control/protection: Condom   Comment: declined condoms 12/2018     Objective:   There were no vitals filed for this visit. There is no height or weight on file to calculate BMI.   Physical Exam Constitutional:      Appearance: Normal appearance. She is not ill-appearing.  HENT:     Mouth/Throat:     Mouth: Mucous membranes are moist.     Pharynx: Oropharynx is clear.  Eyes:     General: No scleral icterus. Pulmonary:     Effort: Pulmonary effort is normal.  Neurological:  Mental Status: She is oriented to person, place, and time.  Psychiatric:        Mood and Affect: Mood normal.        Thought Content: Thought content normal.     Lab Results Lab Results  Component Value Date   WBC 4.8 04/19/2018   HGB 12.3 04/19/2018   HCT 37.0 04/19/2018   MCV 78.2 (L) 04/19/2018   PLT 225 04/19/2018    Lab Results  Component Value Date   CREATININE 0.83 04/19/2018   BUN 9 04/19/2018   NA 140 04/19/2018   K 3.8 04/19/2018   CL 106 04/19/2018   CO2 24 04/19/2018    Lab Results  Component Value Date   ALT 24 04/19/2018   AST 17 04/19/2018   ALKPHOS 48  06/05/2014   BILITOT 0.6 04/19/2018    Lab Results  Component Value Date   CHOL 233 (H) 08/26/2017   HDL 55 08/26/2017   LDLCALC 147 (H) 08/26/2017   TRIG 177 (H) 08/26/2017   CHOLHDL 4.2 08/26/2017   HIV 1 RNA Quant (copies/mL)  Date Value  05/03/2019 <20 NOT DETECTED  12/07/2018 <20 NOT DETECTED  04/19/2018 <20 DETECTED (A)   CD4 T Cell Abs (/uL)  Date Value  05/03/2019 785  04/19/2018 920  02/11/2018 1,080     Assessment & Plan:   Problem List Items Addressed This Visit      Unprioritized   HIV disease ([place]) (Chronic)    Continues to have perfectly controlled HIV on Tivicay + Truvada. Will plan to send in new Rx for [facility] she can resume after she completes current bottles. Counseled to remember to take [facility] with a full chewable meal and avoid PPIs / Acid reducers.  Will have her back in 3-4 months for labs and a virtual appt.       Relevant Medications   emtricitabine-rilpivir-tenofovir AF (ODEFSEY) 200-25-25 MG TABS tablet   Pregnancy and infectious disease in third trimester    Discussed intra and post partum care and advocating for bottle feeding, which she plans to do.  She would like to resume Depo injections - counseled on intervals for these and consistency to avoid lapses so she has reliable birth control. I asked her to discuss with her OB team regarding specifically the timing for her first shot post partum and offered we could continue these for her if she desires.  Will discuss again at our next OV      Relevant Medications   emtricitabine-rilpivir-tenofovir AF (ODEFSEY) 200-25-25 MG TABS tablet     Follow Up Instructions: Stop Tivicay + Truvada after delivery - resume Odefsey once a day with food FU in 3-4 months with labs and to discuss further contraception.    I discussed the assessment and treatment plan with the patient. The patient was provided an opportunity to ask questions and all were answered. The patient agreed with the plan and  demonstrated an understanding of the instructions.   The patient was advised to call back or seek an in-person evaluation if the symptoms worsen or if the condition fails to improve as anticipated.  I provided 12 minutes of non-face-to-face time during this encounter.   Janene Madeira, MSN, NP-C [facility] for Infectious Disease [email or web address] Pager: [phone] Office: [phone] [facility] Main Line: [place], MSN, NP-C [place] for [facility] [place] [facility] Pager: [phone] Office: [phone]  06/09/19

## 2019-06-12 DIAGNOSIS — Z3A38 38 weeks gestation of pregnancy: Secondary | ICD-10-CM | POA: Diagnosis not present

## 2019-06-12 DIAGNOSIS — Z87898 Personal history of other specified conditions: Secondary | ICD-10-CM | POA: Diagnosis not present

## 2019-06-12 DIAGNOSIS — O9872 Human immunodeficiency virus [HIV] disease complicating childbirth: Secondary | ICD-10-CM | POA: Diagnosis not present

## 2019-06-12 DIAGNOSIS — Z21 Asymptomatic human immunodeficiency virus [HIV] infection status: Secondary | ICD-10-CM | POA: Diagnosis not present

## 2019-06-12 DIAGNOSIS — Z87891 Personal history of nicotine dependence: Secondary | ICD-10-CM | POA: Diagnosis not present

## 2019-06-12 DIAGNOSIS — O365990 Maternal care for other known or suspected poor fetal growth, unspecified trimester, not applicable or unspecified: Secondary | ICD-10-CM

## 2019-06-12 DIAGNOSIS — O365930 Maternal care for other known or suspected poor fetal growth, third trimester, not applicable or unspecified: Secondary | ICD-10-CM | POA: Diagnosis not present

## 2019-06-12 DIAGNOSIS — O99824 Streptococcus B carrier state complicating childbirth: Secondary | ICD-10-CM | POA: Diagnosis not present

## 2019-06-19 DIAGNOSIS — K5900 Constipation, unspecified: Secondary | ICD-10-CM | POA: Diagnosis not present

## 2019-06-19 DIAGNOSIS — O872 Hemorrhoids in the puerperium: Secondary | ICD-10-CM | POA: Diagnosis not present

## 2020-05-27 NOTE — Telephone Encounter (Signed)
Patient called office to request short supply of Odefsey sent to local pharmacy. States that she is still waiting on her prescription from [place] and is not sure when this will arrive to her. Patient is currently staying in [place], [place] with family due to funeral.  Sherry Robbins her last dose of medication Sunday 4/24. Insurance will not cover emergency refill. Will forward message to provider and pharmacy team for assistance.

## 2020-05-28 ENCOUNTER — Encounter: Payer: Self-pay | Admitting: Pharmacist

## 2020-05-28 ENCOUNTER — Telehealth: Payer: Self-pay

## 2020-05-28 NOTE — Telephone Encounter (Signed)
I'm not sure what we are able to do. If she has active insurance, [employer] won't cover any supply. The best thing she can do is have someone pick up her medication here and mail it to her. Otherwise, she may have to go without medication until she returns. They also don't  usually do partial supplies of HIV medications, so she can't get like a 10 day supply.. it will have to be a full month (30 day bottle).

## 2020-05-28 NOTE — Progress Notes (Signed)
Medication Samples have been provided to the patient.  Drug name: Dovato        Strength: 50/300 mg         Qty: 28 (2 bottles)  LOT: [id number]   Exp.Date: 09/02/21  Dosing instructions: Take one tablet by mouth once daily  The patient has been instructed regarding the correct time, dose, and frequency of taking this medication, including desired effects and most common side effects.   Perl Folmar L. Jannette Fogo, PharmD, BCIDP, AAHIVP, CPP Clinical Pharmacist Practitioner Infectious Diseases Clinical Pharmacist [facility] 01/15/2020, 10:07 AM

## 2020-05-28 NOTE — Telephone Encounter (Signed)
Spoke with patient who is willing to have family memeber pick up Dovato samples and overnight ship it to her. Will forward message to pharmacy team so they are aware.  Sherry Robbins, RMA

## 2020-05-28 NOTE — Telephone Encounter (Signed)
So they are going to switch her to Dovato ?

## 2020-05-28 NOTE — Telephone Encounter (Signed)
Ok sounds good. I will get them ready for her.

## 2020-06-20 ENCOUNTER — Other Ambulatory Visit: Payer: Self-pay | Admitting: Infectious Diseases

## 2020-06-20 NOTE — Telephone Encounter (Signed)
Patient has been on [place], but had to bridge with Dovato samples due to shipping issues with her Odefsey. RN confirmed with Marcos Eke, NP that okay to refill [facility] for patient at this time. She will need follow up appointment for future refills as she is overdue for labs and appointment.   Sandie Ano, RN

## 2020-07-02 ENCOUNTER — Other Ambulatory Visit: Payer: Medicaid Other

## 2020-07-18 ENCOUNTER — Telehealth: Payer: Self-pay | Admitting: *Deleted

## 2020-07-18 NOTE — Telephone Encounter (Signed)
Left message for patient, asked her to call back. Overdue for appointment and likely needs medication. Andree Coss, RN

## 2020-07-26 ENCOUNTER — Other Ambulatory Visit: Payer: Self-pay | Admitting: Infectious Diseases

## 2020-07-26 ENCOUNTER — Other Ambulatory Visit: Payer: Self-pay

## 2020-07-26 ENCOUNTER — Telehealth: Payer: Self-pay

## 2020-07-26 ENCOUNTER — Other Ambulatory Visit: Payer: Medicaid Other

## 2020-07-26 DIAGNOSIS — B20 Human immunodeficiency virus [HIV] disease: Secondary | ICD-10-CM

## 2020-07-26 DIAGNOSIS — Z79899 Other long term (current) drug therapy: Secondary | ICD-10-CM

## 2020-07-26 NOTE — Telephone Encounter (Signed)
error 

## 2020-07-29 LAB — CBC WITH DIFFERENTIAL/PLATELET
Absolute Monocytes: 495 cells/uL (ref 200–950)
Basophils Absolute: 40 cells/uL (ref 0–200)
Basophils Relative: 0.8 %
Eosinophils Absolute: 320 cells/uL (ref 15–500)
Eosinophils Relative: 6.4 %
HCT: 36.5 % (ref 35.0–45.0)
Hemoglobin: 11.6 g/dL — ABNORMAL LOW (ref 11.7–15.5)
Lymphs Abs: 2305 cells/uL (ref 850–3900)
MCH: 25.9 pg — ABNORMAL LOW (ref 27.0–33.0)
MCHC: 31.8 g/dL — ABNORMAL LOW (ref 32.0–36.0)
MCV: 81.5 fL (ref 80.0–100.0)
MPV: 10.9 fL (ref 7.5–12.5)
Monocytes Relative: 9.9 %
Neutro Abs: 1840 cells/uL (ref 1500–7800)
Neutrophils Relative %: 36.8 %
Platelets: 268 10*3/uL (ref 140–400)
RBC: 4.48 10*6/uL (ref 3.80–5.10)
RDW: 15.7 % — ABNORMAL HIGH (ref 11.0–15.0)
Total Lymphocyte: 46.1 %
WBC: 5 10*3/uL (ref 3.8–10.8)

## 2020-07-29 LAB — COMPLETE METABOLIC PANEL WITH GFR
AG Ratio: 1.4 (calc) (ref 1.0–2.5)
ALT: 14 U/L (ref 6–29)
AST: 14 U/L (ref 10–30)
Albumin: 4 g/dL (ref 3.6–5.1)
Alkaline phosphatase (APISO): 64 U/L (ref 31–125)
BUN: 10 mg/dL (ref 7–25)
CO2: 27 mmol/L (ref 20–32)
Calcium: 9 mg/dL (ref 8.6–10.2)
Chloride: 106 mmol/L (ref 98–110)
Creat: 0.7 mg/dL (ref 0.50–1.10)
GFR, Est African American: 140 mL/min/{1.73_m2} (ref >=60)
GFR, Est Non African American: 120 mL/min/{1.73_m2} (ref >=60)
Globulin: 2.8 g/dL (calc) (ref 1.9–3.7)
Glucose, Bld: 81 mg/dL (ref 65–99)
Potassium: 4.1 mmol/L (ref 3.5–5.3)
Sodium: 141 mmol/L (ref 135–146)
Total Bilirubin: 0.5 mg/dL (ref 0.2–1.2)
Total Protein: 6.8 g/dL (ref 6.1–8.1)

## 2020-07-29 LAB — LIPID PANEL
Cholesterol: 237 mg/dL — ABNORMAL HIGH (ref <200)
HDL: 71 mg/dL (ref >=50)
LDL Cholesterol (Calc): 125 mg/dL (calc) — ABNORMAL HIGH
Non-HDL Cholesterol (Calc): 166 mg/dL (calc) — ABNORMAL HIGH (ref <130)
Total CHOL/HDL Ratio: 3.3 (calc) (ref <5.0)
Triglycerides: 267 mg/dL — ABNORMAL HIGH (ref <150)

## 2020-07-29 LAB — T-HELPER CELLS (CD4) COUNT (NOT AT ARMC)
Absolute CD4: 1075 cells/uL (ref 490–1740)
CD4 T Helper %: 48 % (ref 30–61)
Total lymphocyte count: 2249 cells/uL (ref 850–3900)

## 2020-07-29 LAB — HIV-1 RNA QUANT-NO REFLEX-BLD
HIV 1 RNA Quant: NOT DETECTED Copies/mL
HIV-1 RNA Quant, Log: NOT DETECTED Log cps/mL

## 2020-08-09 ENCOUNTER — Encounter: Payer: Medicaid Other | Admitting: Infectious Diseases

## 2020-08-22 ENCOUNTER — Other Ambulatory Visit: Payer: Self-pay | Admitting: Infectious Diseases

## 2020-09-26 ENCOUNTER — Encounter: Payer: Medicaid Other | Admitting: Infectious Diseases

## 2020-10-21 ENCOUNTER — Encounter: Payer: Self-pay | Admitting: *Deleted

## 2020-10-21 ENCOUNTER — Other Ambulatory Visit: Payer: Self-pay | Admitting: Infectious Diseases

## 2020-10-21 DIAGNOSIS — B20 Human immunodeficiency virus [HIV] disease: Secondary | ICD-10-CM

## 2020-11-17 ENCOUNTER — Other Ambulatory Visit: Payer: Self-pay | Admitting: Infectious Diseases

## 2020-11-17 DIAGNOSIS — B20 Human immunodeficiency virus [HIV] disease: Secondary | ICD-10-CM

## 2020-11-18 ENCOUNTER — Other Ambulatory Visit: Payer: Self-pay

## 2020-11-18 DIAGNOSIS — B20 Human immunodeficiency virus [HIV] disease: Secondary | ICD-10-CM

## 2020-11-19 MED ORDER — ODEFSEY 200-25-25 MG PO TABS: 1.0000 | ORAL_TABLET | Freq: Every day | ORAL | 0 refills | Status: DC

## 2020-11-19 NOTE — Telephone Encounter (Signed)
Are you okay with one additional refill until she can come in? Last appointment was virtual 06/2019

## 2020-11-20 NOTE — Telephone Encounter (Signed)
Refill sent to pharmacy 11/19/20. FYI.  Andree Coss, RN

## 2020-12-22 ENCOUNTER — Other Ambulatory Visit: Payer: Self-pay | Admitting: Infectious Diseases

## 2020-12-22 DIAGNOSIS — B20 Human immunodeficiency virus [HIV] disease: Secondary | ICD-10-CM

## 2020-12-24 ENCOUNTER — Other Ambulatory Visit: Payer: Self-pay

## 2020-12-24 ENCOUNTER — Telehealth: Payer: Self-pay

## 2020-12-24 DIAGNOSIS — B20 Human immunodeficiency virus [HIV] disease: Secondary | ICD-10-CM

## 2020-12-24 MED ORDER — ODEFSEY 200-25-25 MG PO TABS: 1.0000 | ORAL_TABLET | Freq: Every day | ORAL | 0 refills | Status: DC

## 2020-12-24 NOTE — Telephone Encounter (Signed)
Patient has not been seen in the office since 12/2018. She often travels for work and it is difficult for her to come in for appointments. She did have a virtual appointment in May of 2021.   RN notified patient via MyChart that it is very important that we see her in person at least once a year so that we can ensure her medication is still a good fit for her. RN made it clear that we would send in one month's worth of medication, but that we will be unable to send in any further refills until she is seen. Patient is aware that she must have an appointment with a provider before any more medication can be refilled.   She states she will have her labs done at [facility] on 01/02/21. RN faxed orders to the [facility] of her choice at [phone].  Sandie Ano, RN

## 2021-01-13 DIAGNOSIS — Z7721 Contact with and (suspected) exposure to potentially hazardous body fluids: Secondary | ICD-10-CM | POA: Diagnosis not present

## 2021-01-13 DIAGNOSIS — B20 Human immunodeficiency virus [HIV] disease: Secondary | ICD-10-CM | POA: Diagnosis not present

## 2021-01-13 DIAGNOSIS — N760 Acute vaginitis: Secondary | ICD-10-CM | POA: Diagnosis not present

## 2021-01-13 DIAGNOSIS — R1030 Lower abdominal pain, unspecified: Secondary | ICD-10-CM | POA: Diagnosis not present

## 2021-01-22 ENCOUNTER — Ambulatory Visit: Payer: Medicaid Other | Admitting: Infectious Diseases

## 2021-01-28 ENCOUNTER — Other Ambulatory Visit: Payer: Self-pay | Admitting: Pharmacist

## 2021-01-28 ENCOUNTER — Other Ambulatory Visit: Payer: Self-pay

## 2021-01-28 ENCOUNTER — Encounter: Payer: Self-pay | Admitting: Pharmacist

## 2021-01-28 ENCOUNTER — Ambulatory Visit (INDEPENDENT_AMBULATORY_CARE_PROVIDER_SITE_OTHER): Payer: Medicaid Other | Admitting: Pharmacist

## 2021-01-28 DIAGNOSIS — Z113 Encounter for screening for infections with a predominantly sexual mode of transmission: Secondary | ICD-10-CM | POA: Diagnosis not present

## 2021-01-28 DIAGNOSIS — B20 Human immunodeficiency virus [HIV] disease: Secondary | ICD-10-CM | POA: Diagnosis not present

## 2021-01-28 DIAGNOSIS — Z79899 Other long term (current) drug therapy: Secondary | ICD-10-CM | POA: Diagnosis not present

## 2021-01-28 MED ORDER — ODEFSEY 200-25-25 MG PO TABS: 1.0000 | ORAL_TABLET | Freq: Every day | ORAL | 5 refills | Status: DC

## 2021-01-28 NOTE — Progress Notes (Signed)
01/28/2021  HPI: Sherry Robbins is a 25 y.o. female who presents to the [facility] pharmacy clinic for HIV follow-up.  Patient Active Problem List   Diagnosis Date Noted   Pregnancy and infectious disease in third trimester 12/21/2018   Encounter for management and injection of injectable progestin contraceptive 05/03/2018   Adverse drug reaction 03/23/2018   HIV disease (HCC) 03/27/2015   Anemia 05/05/2014    Patient's Medications  New Prescriptions   No medications on file  Previous Medications   EMTRICITABINE-RILPIVIR-TENOFOVIR AF (ODEFSEY) 200-25-25 MG TABS TABLET    Take 1 tablet by mouth daily.  Modified Medications   No medications on file  Discontinued Medications   No medications on file    Allergies: Allergies  Allergen Reactions   Ceftriaxone Other (See Comments)    Reaction:  Fever and cytopenia   Cephalexin Other (See Comments)    Reaction:  Fever and cytopenia   Zithromax [Azithromycin] Other (See Comments)    Reaction:  Fever and cytopenia    Past Medical History: Past Medical History:  Diagnosis Date   HIV disease (HCC) 06/2014   Tachycardia 10/2014    Social History: Social History   Socioeconomic History   Marital status: Single    Spouse name: Not on file   Number of children: Not on file   Years of education: Not on file   Highest education level: Not on file  Occupational History   Not on file  Tobacco Use   Smoking status: Former    Types: Cigars    Quit date: 08/30/2014    Years since quitting: 6.4   Smokeless tobacco: Never  Vaping Use   Vaping Use: Never used  Substance and Sexual Activity   Alcohol use: Not Currently   Drug use: No   Sexual activity: Yes    Partners: Male    Birth control/protection: Condom    Comment: declined condoms 12/2018  Other Topics Concern   Not on file  Social History Narrative   Not on file   Social Determinants of Health   Financial Resource Strain: Not on file  Food Insecurity: Not on file   Transportation Needs: Not on file  Physical Activity: Not on file  Stress: Not on file  Social Connections: Not on file    Labs: Lab Results  Component Value Date   HIV1RNAQUANT Not Detected 07/26/2020   HIV1RNAQUANT <20 NOT DETECTED 05/03/2019   HIV1RNAQUANT <20 NOT DETECTED 12/07/2018   CD4TABS 785 05/03/2019   CD4TABS 920 04/19/2018   CD4TABS 1,080 02/11/2018    RPR and STI Lab Results  Component Value Date   LABRPR NON-REACTIVE 12/07/2018   LABRPR REACTIVE (A) 08/26/2017   LABRPR Reactive (A) 01/04/2016   LABRPR Reactive (A) 12/21/2015   LABRPR Reactive (A) 03/27/2015   RPRTITER 1:1 (H) 08/26/2017    STI Results GC CT  08/26/2017 Negative Negative  01/04/2016 Negative Negative  11/25/2014 - Negative  04/15/2014 - Positive  04/04/2014 **NG: POSITIVE**(A) Negative  04/04/2014 - **CT: POSITIVE**(A)    Hepatitis B Lab Results  Component Value Date   HEPBSAB NON-REACTIVE 08/26/2017   HEPBSAG NON-REACTIVE 08/26/2017   HEPBCAB NON-REACTIVE 08/26/2017   Hepatitis C Lab Results  Component Value Date   HEPCAB NON-REACTIVE 08/26/2017   Hepatitis A Lab Results  Component Value Date   HAV REACTIVE (A) 08/26/2017   Lipids: Lab Results  Component Value Date   CHOL 237 (H) 07/26/2020   TRIG 267 (H) 07/26/2020   HDL 71  07/26/2020   CHOLHDL 3.3 07/26/2020   LDLCALC 125 (H) 07/26/2020    Current HIV Regimen: Odefsey  Assessment: Adair is here today to follow up for her HIV infection. She was last seen in the office on 12/21/2018 but had a virtual appointment with Judeth Cornfield on 06/09/2019. 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 She takes her Charlett Lango every day and took her last pill today of her current prescription. No missed doses lately. Her viral load was undetectable when checked back in June. CD4 was healthy at 1,075. She has [place] [employer] and cannot fill her Charlett Lango out of state, so she has her mother pick up  her prescription and mail it to her when she is not in [place]. She is not on a job right now but thinks she will have another assignment in the middle of January. She never knows when or where she is going until she gets a phone call. She does not always take Odefsey with a meal, so I explained the need to start doing that for the medication to work properly.   Her cholesterol numbers are out of range and have gotten worse over the last several years. Total cholesterol is elevated at 237 and triglycerides are 267 (previously 177). Discussed this with her. She does not know if high cholesterol runs in her family and she does not exercise currently. 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 Encouraged her to try and start exercising several days per week (walking, etc). Also encouraged her to try and eat less processed food. I think she will benefit from a low-dose statin but will reach out to [facility] to get her thoughts. I will discuss with patient over mychart.  She saw her primary care doctor a few weeks ago and was diagnosed with bacterial vaginosis. She took an antibiotic for this (she is not sure which one but it was probably Flagyl) and completed it with resolution of symptoms. Gonorrhea and chlamydia were negative at that time She has not been sexually active since then.   Will recheck HIV viral load today, a lipid panel, and RPR since she has not had that checked in awhile. Will refill her Odefsey as well. She is worried about having to cancel further appointments due to assignments, but I told her that as long as she cancels ahead of time, then it is ok. Will go ahead and schedule a follow up with Judeth Cornfield in 3 months. This will also be a good time to recheck her cholesterol if we start a statin.   Answered all questions. Asked her to reach out if she has any that come up.  Plan: - Refill Odefsey - [facility] - HIV viral load, lipid  panel, RPR - Follow up on statin initiation - F/u with Judeth Cornfield on 3/13 at 10am  Tora Prunty L. Jannette Fogo, PharmD, BCIDP, AAHIVP, CPP Clinical Pharmacist Practitioner Infectious Diseases Clinical Pharmacist [facility] 01/28/2021, 9:40 AM +

## 2021-01-29 NOTE — Progress Notes (Signed)
She came in at 9am but yeah I forgot to confirm. Thanks

## 2021-01-29 NOTE — Progress Notes (Signed)
Just FYI - triglycerides keep climbing!

## 2021-01-30 LAB — LIPID PANEL
Cholesterol: 212 mg/dL — ABNORMAL HIGH (ref <200)
HDL: 45 mg/dL — ABNORMAL LOW (ref >=50)
LDL Cholesterol (Calc): 113 mg/dL (calc) — ABNORMAL HIGH
Non-HDL Cholesterol (Calc): 167 mg/dL (calc) — ABNORMAL HIGH (ref <130)
Total CHOL/HDL Ratio: 4.7 (calc) (ref <5.0)
Triglycerides: 381 mg/dL — ABNORMAL HIGH (ref <150)

## 2021-01-30 LAB — RPR TITER: RPR Titer: 1:1 {titer} — ABNORMAL HIGH

## 2021-01-30 LAB — HIV-1 RNA QUANT-NO REFLEX-BLD
HIV 1 RNA Quant: NOT DETECTED Copies/mL
HIV-1 RNA Quant, Log: NOT DETECTED Log cps/mL

## 2021-01-30 LAB — RPR: RPR Ser Ql: REACTIVE — AB

## 2021-01-30 LAB — FLUORESCENT TREPONEMAL AB(FTA)-IGG-BLD: Fluorescent Treponemal ABS: REACTIVE — AB

## 2021-04-14 ENCOUNTER — Telehealth: Payer: Self-pay

## 2021-04-14 ENCOUNTER — Ambulatory Visit: Payer: Medicaid Other | Admitting: Infectious Diseases

## 2021-04-14 NOTE — Telephone Encounter (Signed)
Called patient to reschedule missed appointment this morning. No answer. Left HIPAA-compliant voicemail requesting a call back. ? ?Nathen Balaban E Severo Beber, RN  ?

## 2021-07-24 ENCOUNTER — Other Ambulatory Visit: Payer: Self-pay | Admitting: Infectious Diseases

## 2021-07-24 DIAGNOSIS — B20 Human immunodeficiency virus [HIV] disease: Secondary | ICD-10-CM

## 2021-09-24 ENCOUNTER — Other Ambulatory Visit: Payer: Self-pay | Admitting: Infectious Diseases

## 2021-09-24 DIAGNOSIS — B20 Human immunodeficiency virus [HIV] disease: Secondary | ICD-10-CM

## 2021-11-24 ENCOUNTER — Other Ambulatory Visit: Payer: Self-pay | Admitting: Infectious Diseases

## 2021-11-24 DIAGNOSIS — B20 Human immunodeficiency virus [HIV] disease: Secondary | ICD-10-CM

## 2021-11-24 NOTE — Telephone Encounter (Signed)
Left vm-needs scheduled for follow up. NO SHOW in March.

## 2021-12-23 ENCOUNTER — Other Ambulatory Visit: Payer: Self-pay | Admitting: Infectious Diseases

## 2021-12-23 DIAGNOSIS — B20 Human immunodeficiency virus [HIV] disease: Secondary | ICD-10-CM

## 2021-12-26 ENCOUNTER — Other Ambulatory Visit: Payer: Self-pay | Admitting: Infectious Diseases

## 2021-12-26 DIAGNOSIS — B20 Human immunodeficiency virus [HIV] disease: Secondary | ICD-10-CM

## 2022-01-29 ENCOUNTER — Other Ambulatory Visit: Payer: Self-pay | Admitting: Infectious Diseases

## 2022-01-29 DIAGNOSIS — B20 Human immunodeficiency virus [HIV] disease: Secondary | ICD-10-CM

## 2022-02-03 ENCOUNTER — Ambulatory Visit: Payer: Medicaid Other | Admitting: Family

## 2022-02-03 ENCOUNTER — Other Ambulatory Visit: Payer: Medicaid Other

## 2022-02-24 ENCOUNTER — Other Ambulatory Visit: Payer: Self-pay

## 2022-02-24 DIAGNOSIS — Z113 Encounter for screening for infections with a predominantly sexual mode of transmission: Secondary | ICD-10-CM

## 2022-02-24 DIAGNOSIS — B20 Human immunodeficiency virus [HIV] disease: Secondary | ICD-10-CM

## 2022-02-25 ENCOUNTER — Other Ambulatory Visit: Payer: Medicaid Other

## 2022-02-25 ENCOUNTER — Other Ambulatory Visit (HOSPITAL_COMMUNITY)
Admission: RE | Admit: 2022-02-25 | Discharge: 2022-02-25 | Disposition: A | Discharge status: Home / Self Care | Payer: Medicaid Other | Source: Ambulatory Visit | Attending: Infectious Diseases | Admitting: Infectious Diseases

## 2022-02-25 ENCOUNTER — Other Ambulatory Visit: Payer: Self-pay

## 2022-02-25 DIAGNOSIS — B20 Human immunodeficiency virus [HIV] disease: Secondary | ICD-10-CM | POA: Diagnosis not present

## 2022-02-25 DIAGNOSIS — Z113 Encounter for screening for infections with a predominantly sexual mode of transmission: Secondary | ICD-10-CM | POA: Insufficient documentation

## 2022-02-26 LAB — T-HELPER CELL (CD4) - (RCID CLINIC ONLY)
CD4 % Helper T Cell: 44 % (ref 33–65)
CD4 T Cell Abs: 998 /uL (ref 400–1790)

## 2022-02-26 LAB — URINE CYTOLOGY ANCILLARY ONLY
Chlamydia: NEGATIVE
Comment: NEGATIVE
Comment: NORMAL
Neisseria Gonorrhea: NEGATIVE

## 2022-02-27 ENCOUNTER — Other Ambulatory Visit: Payer: Self-pay | Admitting: Infectious Diseases

## 2022-02-27 DIAGNOSIS — B20 Human immunodeficiency virus [HIV] disease: Secondary | ICD-10-CM

## 2022-02-28 LAB — RPR TITER: RPR Titer: 1:1 {titer} — ABNORMAL HIGH

## 2022-02-28 LAB — CBC WITH DIFFERENTIAL/PLATELET
Absolute Monocytes: 449 cells/uL (ref 200–950)
Basophils Absolute: 31 cells/uL (ref 0–200)
Basophils Relative: 0.6 %
Eosinophils Absolute: 189 cells/uL (ref 15–500)
Eosinophils Relative: 3.7 %
HCT: 39.4 % (ref 35.0–45.0)
Hemoglobin: 13 g/dL (ref 11.7–15.5)
Lymphs Abs: 2489 cells/uL (ref 850–3900)
MCH: 27.7 pg (ref 27.0–33.0)
MCHC: 33 g/dL (ref 32.0–36.0)
MCV: 84 fL (ref 80.0–100.0)
MPV: 10.6 fL (ref 7.5–12.5)
Monocytes Relative: 8.8 %
Neutro Abs: 1943 cells/uL (ref 1500–7800)
Neutrophils Relative %: 38.1 %
Platelets: 228 10*3/uL (ref 140–400)
RBC: 4.69 10*6/uL (ref 3.80–5.10)
RDW: 13.7 % (ref 11.0–15.0)
Total Lymphocyte: 48.8 %
WBC: 5.1 10*3/uL (ref 3.8–10.8)

## 2022-02-28 LAB — COMPLETE METABOLIC PANEL WITH GFR
AG Ratio: 1.7 (calc) (ref 1.0–2.5)
ALT: 14 U/L (ref 6–29)
AST: 13 U/L (ref 10–30)
Albumin: 4.6 g/dL (ref 3.6–5.1)
Alkaline phosphatase (APISO): 58 U/L (ref 31–125)
BUN: 13 mg/dL (ref 7–25)
CO2: 25 mmol/L (ref 20–32)
Calcium: 9.3 mg/dL (ref 8.6–10.2)
Chloride: 105 mmol/L (ref 98–110)
Creat: 0.76 mg/dL (ref 0.50–0.96)
Globulin: 2.7 g/dL (calc) (ref 1.9–3.7)
Glucose, Bld: 92 mg/dL (ref 65–99)
Potassium: 3.9 mmol/L (ref 3.5–5.3)
Sodium: 140 mmol/L (ref 135–146)
Total Bilirubin: 0.4 mg/dL (ref 0.2–1.2)
Total Protein: 7.3 g/dL (ref 6.1–8.1)
eGFR: 111 mL/min/{1.73_m2} (ref >=60)

## 2022-02-28 LAB — HIV-1 RNA QUANT-NO REFLEX-BLD
HIV 1 RNA Quant: NOT DETECTED Copies/mL
HIV-1 RNA Quant, Log: NOT DETECTED Log cps/mL

## 2022-02-28 LAB — RPR: RPR Ser Ql: REACTIVE — AB

## 2022-02-28 LAB — T PALLIDUM AB: T Pallidum Abs: POSITIVE — AB

## 2022-03-05 ENCOUNTER — Telehealth: Payer: Self-pay

## 2022-03-05 ENCOUNTER — Encounter: Payer: Self-pay | Admitting: Infectious Diseases

## 2022-03-05 ENCOUNTER — Ambulatory Visit: Payer: Medicaid Other | Admitting: Infectious Diseases

## 2022-03-05 ENCOUNTER — Other Ambulatory Visit: Payer: Self-pay

## 2022-03-05 VITALS — BP 119/79 | HR 90 | Temp 97.3°F | Ht 64.0 in | Wt 114.0 lb

## 2022-03-05 DIAGNOSIS — A539 Syphilis, unspecified: Secondary | ICD-10-CM | POA: Diagnosis not present

## 2022-03-05 DIAGNOSIS — Z5181 Encounter for therapeutic drug level monitoring: Secondary | ICD-10-CM | POA: Diagnosis not present

## 2022-03-05 DIAGNOSIS — B20 Human immunodeficiency virus [HIV] disease: Secondary | ICD-10-CM

## 2022-03-05 DIAGNOSIS — Z7185 Encounter for immunization safety counseling: Secondary | ICD-10-CM | POA: Diagnosis not present

## 2022-03-05 DIAGNOSIS — Z113 Encounter for screening for infections with a predominantly sexual mode of transmission: Secondary | ICD-10-CM | POA: Insufficient documentation

## 2022-03-05 MED ORDER — PENICILLIN G BENZATHINE 1200000 UNIT/2ML IM SUSY
1.2000 10*6.[IU] | PREFILLED_SYRINGE | Freq: Once | INTRAMUSCULAR | Status: AC
  Administered 2022-03-05: 1.2 10*6.[IU] via INTRAMUSCULAR

## 2022-03-05 MED ORDER — DOXYCYCLINE HYCLATE 100 MG PO TABS: 100.0000 mg | ORAL_TABLET | Freq: Two times a day (BID) | ORAL | 0 refills | Status: DC

## 2022-03-05 MED ORDER — PENICILLIN G BENZATHINE 1200000 UNIT/2ML IM SUSY: 1.2000 10*6.[IU] | PREFILLED_SYRINGE | Freq: Once | INTRAMUSCULAR | Status: DC

## 2022-03-05 MED ORDER — ODEFSEY 200-25-25 MG PO TABS: 1.0000 | ORAL_TABLET | Freq: Every day | ORAL | 5 refills | Status: DC

## 2022-03-05 NOTE — Telephone Encounter (Signed)
error 

## 2022-03-05 NOTE — Progress Notes (Addendum)
[facility], [place], [place], [address]                                                                  Phn. [phone]; Fax: [phone]                                                                             Date: 03/05/22  Reason for Visit: HIV follow up, ran out of meds   HPI: Sherry Robbins is a 27 y.o.old female with a history of HIV, well controlled, on Odefsey ( diagnosed in 2016, previously on descovy/intelence>odefsey> [place] and truvada in 2020/2021 during pregnancy then back to [facility]) who is here as she has ran out of her medications.  Off of ART for 4 days but consistently taking it prior to that. Abner Greenspan with food. Denies any side effects or concerns. Feels depressed at times but no SI and HI. Wants to see a mental health counselor. Declined vaccines today, No acute complaints today  Social: lives with her mother and 18 year old son Occupation : works in an Scientist, product/process development Smoking/alcohol/illicit drugs: Denies smoking, alcohol occasionally and denies IVDU.  Sexual activity: not sexually active since early 2023.   ROS: Denies fevers, chills. Denies nausea, vomiting, abdominal pain, diarrhea/constipation, loss of appetite and weight loss, chills. Denies SOB, cough and chest pain. Denies GU complaints. Denies recent ED visits and recent hospitalizations. Denies rashes, joint complaints, headaches/neurological complaints  No current outpatient medications on file prior to visit.   No current facility-administered medications on file prior to visit.                               Allergies  Allergen Reactions   Ceftriaxone Other (See Comments)    Reaction:  Fever and cytopenia   Cephalexin Other (See Comments)    Reaction:  Fever and cytopenia   Zithromax  [Azithromycin] Other (See Comments)    Reaction:  Fever and cytopenia   Past Medical History:  Diagnosis Date   HIV disease ([place]) 06/2014   Tachycardia 10/2014   Past Surgical History:  Procedure Laterality Date   NO PAST SURGERIES     Social History   Socioeconomic History   Marital status: Single    Spouse name: Not on file   Number of children: Not on file   Years of education: Not on file   Highest education level: Not on file  Occupational History   Not on file  Tobacco Use   Smoking status: Former    Types: Cigars  Quit date: 08/30/2014    Years since quitting: 7.5   Smokeless tobacco: Never  Vaping Use   Vaping Use: Never used  Substance and Sexual Activity   Alcohol use: Yes    Comment: occ   Drug use: Yes    Types: Marijuana   Sexual activity: Not Currently    Partners: Male    Birth control/protection: Condom    Comment: declined condoms  Other Topics Concern   Not on file  Social History Narrative   Not on file   Social Determinants of Health   Financial Resource Strain: Not on file  Food Insecurity: Not on file  Transportation Needs: Not on file  Physical Activity: Not on file  Stress: Not on file  Social Connections: Not on file  Intimate Partner Violence: Not on file   Family History  Problem Relation Age of Onset   Cancer Maternal Grandmother    Allergic rhinitis Mother    Food Allergy Mother    Asthma Neg Hx    Eczema Neg Hx    Urticaria Neg Hx   '  Vitals  BP 119/79   Pulse 90   Temp (!) 97.3 F (36.3 C) (Temporal)   Ht 5\' 4"  (1.626 m)   Wt 114 lb (51.7 kg)   SpO2 100%   BMI 19.57 kg/m    Examination  Gen: Alert and oriented x 3, no acute distress HEENT: [place]/AT, no scleral icterus, no pale conjunctivae, hearing normal, oral mucosa moist Neck: Supple Cardio: Regular rate and rhythm; +S1 and S2 Resp: CTAB GI: nondistended GU: Musc: Extremities: No pedal ledema Skin: No rashes Neuro: grossly non focal  Psych: Calm,  cooperative    Lab Results HIV 1 RNA Quant (Copies/mL)  Date Value  02/25/2022 Not Detected  01/28/2021 Not Detected  07/26/2020 Not Detected   CD4 T Cell Abs (/uL)  Date Value  02/25/2022 998  05/03/2019 785  04/19/2018 920   No results found for: "HIV1GENOSEQ" Lab Results  Component Value Date   WBC 5.1 02/25/2022   HGB 13.0 02/25/2022   HCT 39.4 02/25/2022   MCV 84.0 02/25/2022   PLT 228 02/25/2022    Lab Results  Component Value Date   CREATININE 0.76 02/25/2022   BUN 13 02/25/2022   NA 140 02/25/2022   K 3.9 02/25/2022   CL 105 02/25/2022   CO2 25 02/25/2022   Lab Results  Component Value Date   ALT 14 02/25/2022   AST 13 02/25/2022   ALKPHOS 48 06/05/2014   BILITOT 0.4 02/25/2022    Lab Results  Component Value Date   CHOL 212 (H) 01/28/2021   TRIG 381 (H) 01/28/2021   HDL 45 (L) 01/28/2021   LDLCALC 113 (H) 01/28/2021   Lab Results  Component Value Date   HAV REACTIVE (A) 08/26/2017   Lab Results  Component Value Date   HEPBSAG NON-REACTIVE 08/26/2017   HEPBSAB NON-REACTIVE 08/26/2017   No results found for: "HCVAB" Lab Results  Component Value Date   CHLAMYDIAWP Negative 02/25/2022   N Negative 02/25/2022   No results found for: "GCPROBEAPT" No results found for: "QUANTGOLD"    Health Maintenance: Immunization History  Administered Date(s) Administered   DTaP 05/26/1995, 07/22/1995, 10/01/1995, 07/05/1996, 07/05/2000   HIB (PRP-T) 05/26/1995, 07/22/1995, 10/01/1995, 07/04/1996   Hepatitis A 07/16/2014   Hepatitis A, Ped/Adol-2 Dose 07/16/2014   Hepatitis B Nov 24, 1995, 04/19/1995, 12/14/1995   Hepatitis B, PED/ADOLESCENT 12-01-1995, 04/19/1995, 12/14/1995   IPV 05/26/1995, 07/22/1995, 07/04/1996, 07/05/2000  Influenza, Seasonal, Injecte, Preservative Fre 11/14/2014, 12/11/2015   Influenza,inj,Quad PF,6+ Mos 12/11/2015, 12/21/2018   Influenza-Unspecified 11/14/2014, 12/11/2015   MMR 07/04/1996, 07/05/2000   Meningococcal  Conjugate 09/10/2017   Meningococcal Mcv4o 09/10/2017   Pneumococcal Conjugate-13 07/16/2014   Pneumococcal Polysaccharide-23 01/06/2016   Tdap 01/06/2016, 04/03/2019   Varicella 07/05/2000       Assessment/Plan: # HIV Off of Odefsey for 4 days, restart odefsey with # 5 refills. She is aware about the need to take with meal and DDI with PPIs/acid reducers Recent labs discussed  Fu in 3 months   # Syphilis  - Previously received one dose of 2.4 million units one dose in 03/29/2015 without any issues. RPR titres were downtrending appropriately and Non-reactive in 12/07/2018. However, 01/28/21 ( RPR reactive 1:1 with positive T pallidum abs) - did not receive tx and again same results in 02/25/22 - Plan for Benzathine Pen G 2.4 million units weekly for 3 doses  # STD Screening  Recently screened and negative No acute concerns   # Immunization  She was not interested in vaccines including Flu  # Health Maintenance: - will discuss in next visit  Patient's labs were reviewed as well as his previous records. Patients questions were addressed and answered. Safe sex counseling done.   I have personally spent 60 minutes involved in face-to-face and non-face-to-face activities for this patient on the day of the visit. Professional time spent includes the following activities: Preparing to see the patient (review of tests), Obtaining and/or reviewing separately obtained history (admission/discharge record), Performing a medically appropriate examination and/or evaluation , Ordering medications/tests/procedures, referring and communicating with other health care professionals, Documenting clinical information in the EMR, Independently interpreting results (not separately reported), Communicating results to the patient/family/caregiver, Counseling and educating the patient/family/caregiver and Care coordination (not separately reported).    Electronically signed by:  Rosiland Oz,  MD Infectious Disease Physician [facility] [address] [place], [place] [address] Phone: [phone]  Fax: [phone]

## 2022-03-12 ENCOUNTER — Ambulatory Visit (INDEPENDENT_AMBULATORY_CARE_PROVIDER_SITE_OTHER): Payer: Medicaid Other

## 2022-03-12 ENCOUNTER — Other Ambulatory Visit: Payer: Self-pay

## 2022-03-12 DIAGNOSIS — A539 Syphilis, unspecified: Secondary | ICD-10-CM | POA: Diagnosis not present

## 2022-03-12 MED ORDER — PENICILLIN G BENZATHINE 1200000 UNIT/2ML IM SUSY
2.4000 10*6.[IU] | PREFILLED_SYRINGE | Freq: Once | INTRAMUSCULAR | Status: AC
  Administered 2022-03-12: 2.4 10*6.[IU] via INTRAMUSCULAR

## 2022-03-18 ENCOUNTER — Ambulatory Visit (INDEPENDENT_AMBULATORY_CARE_PROVIDER_SITE_OTHER): Payer: Medicaid Other

## 2022-03-18 ENCOUNTER — Other Ambulatory Visit: Payer: Self-pay

## 2022-03-18 DIAGNOSIS — A539 Syphilis, unspecified: Secondary | ICD-10-CM

## 2022-03-18 MED ORDER — PENICILLIN G BENZATHINE 1200000 UNIT/2ML IM SUSY
1.2000 10*6.[IU] | PREFILLED_SYRINGE | Freq: Once | INTRAMUSCULAR | Status: AC
  Administered 2022-03-18: 1.2 10*6.[IU] via INTRAMUSCULAR

## 2022-03-24 ENCOUNTER — Ambulatory Visit: Payer: Medicaid Other

## 2022-06-09 ENCOUNTER — Ambulatory Visit: Payer: Medicaid Other | Admitting: Infectious Diseases

## 2022-08-28 ENCOUNTER — Other Ambulatory Visit: Payer: Self-pay | Admitting: Infectious Diseases

## 2022-08-28 DIAGNOSIS — B20 Human immunodeficiency virus [HIV] disease: Secondary | ICD-10-CM

## 2022-08-29 DIAGNOSIS — R112 Nausea with vomiting, unspecified: Secondary | ICD-10-CM | POA: Diagnosis not present

## 2022-08-29 DIAGNOSIS — R590 Localized enlarged lymph nodes: Secondary | ICD-10-CM | POA: Diagnosis not present

## 2022-08-29 DIAGNOSIS — K649 Unspecified hemorrhoids: Secondary | ICD-10-CM | POA: Diagnosis not present

## 2022-08-29 DIAGNOSIS — N9089 Other specified noninflammatory disorders of vulva and perineum: Secondary | ICD-10-CM | POA: Diagnosis not present

## 2022-08-31 ENCOUNTER — Emergency Department (HOSPITAL_BASED_OUTPATIENT_CLINIC_OR_DEPARTMENT_OTHER)
Admission: EM | Admit: 2022-08-31 | Discharge: 2022-08-31 | Disposition: A | Discharge status: Home / Self Care | Payer: Medicaid Other | Attending: Emergency Medicine | Admitting: Emergency Medicine

## 2022-08-31 ENCOUNTER — Other Ambulatory Visit (HOSPITAL_BASED_OUTPATIENT_CLINIC_OR_DEPARTMENT_OTHER): Payer: Self-pay

## 2022-08-31 ENCOUNTER — Emergency Department (HOSPITAL_BASED_OUTPATIENT_CLINIC_OR_DEPARTMENT_OTHER): Payer: Medicaid Other

## 2022-08-31 ENCOUNTER — Encounter (HOSPITAL_BASED_OUTPATIENT_CLINIC_OR_DEPARTMENT_OTHER): Payer: Self-pay

## 2022-08-31 ENCOUNTER — Other Ambulatory Visit: Payer: Self-pay

## 2022-08-31 DIAGNOSIS — K529 Noninfective gastroenteritis and colitis, unspecified: Secondary | ICD-10-CM | POA: Insufficient documentation

## 2022-08-31 DIAGNOSIS — K8020 Calculus of gallbladder without cholecystitis without obstruction: Secondary | ICD-10-CM | POA: Diagnosis not present

## 2022-08-31 DIAGNOSIS — R109 Unspecified abdominal pain: Secondary | ICD-10-CM | POA: Diagnosis not present

## 2022-08-31 DIAGNOSIS — Z21 Asymptomatic human immunodeficiency virus [HIV] infection status: Secondary | ICD-10-CM | POA: Insufficient documentation

## 2022-08-31 DIAGNOSIS — R509 Fever, unspecified: Secondary | ICD-10-CM | POA: Diagnosis not present

## 2022-08-31 DIAGNOSIS — R112 Nausea with vomiting, unspecified: Secondary | ICD-10-CM | POA: Diagnosis not present

## 2022-08-31 LAB — COMPREHENSIVE METABOLIC PANEL
ALT: 12 U/L (ref 0–44)
AST: 14 U/L — ABNORMAL LOW (ref 15–41)
Albumin: 4.8 g/dL (ref 3.5–5.0)
Alkaline Phosphatase: 43 U/L (ref 38–126)
Anion gap: 9 (ref 5–15)
BUN: 14 mg/dL (ref 6–20)
CO2: 28 mmol/L (ref 22–32)
Calcium: 9.4 mg/dL (ref 8.9–10.3)
Chloride: 101 mmol/L (ref 98–111)
Creatinine, Ser: 0.83 mg/dL (ref 0.44–1.00)
GFR, Estimated: >60 mL/min (ref >60)
Glucose, Bld: 100 mg/dL — ABNORMAL HIGH (ref 70–99)
Potassium: 3.3 mmol/L — ABNORMAL LOW (ref 3.5–5.1)
Sodium: 138 mmol/L (ref 135–145)
Total Bilirubin: 2 mg/dL — ABNORMAL HIGH (ref 0.3–1.2)
Total Protein: 7.7 g/dL (ref 6.5–8.1)

## 2022-08-31 LAB — URINALYSIS, ROUTINE W REFLEX MICROSCOPIC
Bilirubin Urine: NEGATIVE
Glucose, UA: NEGATIVE mg/dL
Hgb urine dipstick: NEGATIVE
Ketones, ur: 40 mg/dL — AB
Leukocytes,Ua: NEGATIVE
Nitrite: NEGATIVE
Specific Gravity, Urine: 1.023 (ref 1.005–1.030)
pH: 7 (ref 5.0–8.0)

## 2022-08-31 LAB — CBC
HCT: 41.7 % (ref 36.0–46.0)
Hemoglobin: 13.8 g/dL (ref 12.0–15.0)
MCH: 27.7 pg (ref 26.0–34.0)
MCHC: 33.1 g/dL (ref 30.0–36.0)
MCV: 83.7 fL (ref 80.0–100.0)
Platelets: 223 10*3/uL (ref 150–400)
RBC: 4.98 MIL/uL (ref 3.87–5.11)
RDW: 13.2 % (ref 11.5–15.5)
WBC: 5.5 10*3/uL (ref 4.0–10.5)
nRBC: 0 % (ref 0.0–0.2)

## 2022-08-31 LAB — LIPASE, BLOOD: Lipase: 73 U/L — ABNORMAL HIGH (ref 11–51)

## 2022-08-31 LAB — PREGNANCY, URINE: Preg Test, Ur: NEGATIVE

## 2022-08-31 MED ORDER — ONDANSETRON 4 MG PO TBDP
4.0000 mg | ORAL_TABLET | Freq: Three times a day (TID) | ORAL | 0 refills | Status: DC | PRN
  Filled 2022-08-31 (×2): qty 20, 7d supply, fill #0

## 2022-08-31 MED ORDER — ONDANSETRON HCL 4 MG/2ML IJ SOLN
4.0000 mg | Freq: Once | INTRAMUSCULAR | Status: AC
  Administered 2022-08-31: 4 mg via INTRAVENOUS
  Filled 2022-08-31: qty 2

## 2022-08-31 MED ORDER — POTASSIUM CHLORIDE CRYS ER 20 MEQ PO TBCR
40.0000 meq | EXTENDED_RELEASE_TABLET | Freq: Once | ORAL | Status: AC
  Administered 2022-08-31: 40 meq via ORAL
  Filled 2022-08-31: qty 2

## 2022-08-31 MED ORDER — AMOXICILLIN-POT CLAVULANATE 875-125 MG PO TABS
1.0000 | ORAL_TABLET | Freq: Two times a day (BID) | ORAL | 0 refills | Status: AC
  Filled 2022-08-31: qty 14, 7d supply, fill #0

## 2022-08-31 MED ORDER — LACTATED RINGERS IV BOLUS
1000.0000 mL | Freq: Once | INTRAVENOUS | Status: AC
  Administered 2022-08-31: 1000 mL via INTRAVENOUS

## 2022-08-31 MED ORDER — IOHEXOL 300 MG/ML  SOLN
80.0000 mL | Freq: Once | INTRAMUSCULAR | Status: AC | PRN
  Administered 2022-08-31: 70 mL via INTRAVENOUS

## 2022-08-31 NOTE — ED Provider Notes (Signed)
[place] EMERGENCY DEPARTMENT AT [facility] Provider Note   CSN: [id number] Arrival date & time: 08/31/22  0831     History {Add pertinent medical, surgical, social history, OB history to HPI:1} Chief Complaint  Patient presents with   Emesis    Sherry Robbins is a 27 y.o. female.  HPI     27yo female with history of HIV (controlled,CD4 998, undetectable viral load 1/24), who presents with nausea and vomiting.  Thursday began to have nausea, vomiting 5-10 episodes per day No hematemesis No diarrhea, has had constipation with last BM Wednesday, not really passing much flatus but has No dysuria, urinary frequency, urinating once per day since was sick Fever, chills Believes not pregnant, LMP was Sund-Mon ended Thursday No abdominal pain Little bit headache, no cp or dyspnea No new medications No  smoking cigarettes, occ etoh Every month for past 2 months has had episodes like this   Past Medical History:  Diagnosis Date   HIV disease (HCC) 06/2014   Tachycardia 10/2014    Home Medications Prior to Admission medications   Medication Sig Start Date End Date Taking? Authorizing Provider  emtricitabine-rilpivir-tenofovir AF (ODEFSEY) 200-25-25 MG TABS tablet Take 1 tablet by mouth daily. NO FURTHER REFILLS UNTIL SEEN IN OFFICE 03/05/22   Odette Fraction, MD      Allergies    Ceftriaxone, Cephalexin, and Zithromax [azithromycin]    Review of Systems   Review of Systems  Physical Exam Updated Vital Signs BP 118/88 (BP Location: Left Arm)   Pulse 65   Temp 98.4 F (36.9 C) (Oral)   Resp 17   Ht 5\' 4"  (1.626 m)   Wt 51.3 kg   SpO2 99%   BMI 19.40 kg/m  Physical Exam Vitals and nursing note reviewed.  Constitutional:      General: She is not in acute distress.    Appearance: She is well-developed. She is not diaphoretic.  HENT:     Head: Normocephalic and atraumatic.  Eyes:     Conjunctiva/sclera: Conjunctivae normal.  Cardiovascular:      Rate and Rhythm: Normal rate and regular rhythm.     Heart sounds: Normal heart sounds. No murmur heard.    No friction rub. No gallop.  Pulmonary:     Effort: Pulmonary effort is normal. No respiratory distress.     Breath sounds: Normal breath sounds. No wheezing or rales.  Abdominal:     General: There is no distension.     Palpations: Abdomen is soft.     Tenderness: There is no abdominal tenderness. There is no guarding.  Musculoskeletal:        General: No tenderness.     Cervical back: Normal range of motion.  Skin:    General: Skin is warm and dry.     Findings: No erythema or rash.  Neurological:     Mental Status: She is alert and oriented to person, place, and time.     ED Results / Procedures / Treatments   Labs (all labs ordered are listed, but only abnormal results are displayed) Labs Reviewed  LIPASE, BLOOD  COMPREHENSIVE METABOLIC PANEL  CBC  URINALYSIS, ROUTINE W REFLEX MICROSCOPIC  PREGNANCY, URINE    EKG None  Radiology No results found.  Procedures Procedures  {Document cardiac monitor, telemetry assessment procedure when appropriate:1}  Medications Ordered in ED Medications - No data to display  ED Course/ Medical Decision Making/ A&P   {   Click here for ABCD2, HEART and  other calculatorsREFRESH Note before signing :1}                          Medical Decision Making Amount and/or Complexity of Data Reviewed Labs: ordered.   ***  {Document critical care time when appropriate:1} {Document review of labs and clinical decision tools ie heart score, Chads2Vasc2 etc:1}  {Document your independent review of radiology images, and any outside records:1} {Document your discussion with family members, caretakers, and with consultants:1} {Document social determinants of health affecting pt's care:1} {Document your decision making why or why not admission, treatments were needed:1} Final Clinical Impression(s) / ED Diagnoses Final diagnoses:   None    Rx / DC Orders ED Discharge Orders     None

## 2022-08-31 NOTE — ED Triage Notes (Signed)
Pt to er, pt states that she is here because she has been getting sick to her stomach about once a month, states that once a month she will get sick to her stomach and nauseated.  Pt states that she has vomited once today.  States that she went to urgent care on Saturday but wasn't dx with anything

## 2022-08-31 NOTE — ED Notes (Signed)
Pt aware of need for urine sample. Unable to void at this time. 

## 2022-08-31 NOTE — ED Notes (Signed)
Patient transported to CT 

## 2022-08-31 NOTE — ED Notes (Signed)
Pt in bed, pt denies pain and nausea, resps even and unlabored

## 2022-08-31 NOTE — ED Notes (Signed)
Pt verbalized understanding of d/c instructions, meds, and followup care. Denies questions. VSS, no distress noted. Steady gait to exit with all belongings.  ?

## 2022-09-01 ENCOUNTER — Emergency Department (HOSPITAL_COMMUNITY)
Admission: EM | Admit: 2022-09-01 | Discharge: 2022-09-01 | Disposition: A | Discharge status: Home / Self Care | Payer: Medicaid Other | Attending: Emergency Medicine | Admitting: Emergency Medicine

## 2022-09-01 DIAGNOSIS — E860 Dehydration: Secondary | ICD-10-CM | POA: Insufficient documentation

## 2022-09-01 DIAGNOSIS — Z21 Asymptomatic human immunodeficiency virus [HIV] infection status: Secondary | ICD-10-CM | POA: Diagnosis not present

## 2022-09-01 DIAGNOSIS — R531 Weakness: Secondary | ICD-10-CM | POA: Diagnosis not present

## 2022-09-01 DIAGNOSIS — R112 Nausea with vomiting, unspecified: Secondary | ICD-10-CM | POA: Diagnosis present

## 2022-09-01 DIAGNOSIS — R197 Diarrhea, unspecified: Secondary | ICD-10-CM | POA: Insufficient documentation

## 2022-09-01 LAB — URINALYSIS, ROUTINE W REFLEX MICROSCOPIC
Bilirubin Urine: NEGATIVE
Glucose, UA: NEGATIVE mg/dL
Hgb urine dipstick: NEGATIVE
Ketones, ur: 80 mg/dL — AB
Leukocytes,Ua: NEGATIVE
Nitrite: NEGATIVE
Protein, ur: NEGATIVE mg/dL
Specific Gravity, Urine: 1.02 (ref 1.005–1.030)
pH: 6 (ref 5.0–8.0)

## 2022-09-01 LAB — COMPREHENSIVE METABOLIC PANEL
ALT: 16 U/L (ref 0–44)
AST: 15 U/L (ref 15–41)
Albumin: 4.1 g/dL (ref 3.5–5.0)
Alkaline Phosphatase: 45 U/L (ref 38–126)
Anion gap: 10 (ref 5–15)
BUN: 11 mg/dL (ref 6–20)
CO2: 27 mmol/L (ref 22–32)
Calcium: 9.1 mg/dL (ref 8.9–10.3)
Chloride: 99 mmol/L (ref 98–111)
Creatinine, Ser: 0.67 mg/dL (ref 0.44–1.00)
GFR, Estimated: >60 mL/min (ref >60)
Glucose, Bld: 80 mg/dL (ref 70–99)
Potassium: 3.7 mmol/L (ref 3.5–5.1)
Sodium: 136 mmol/L (ref 135–145)
Total Bilirubin: 2.3 mg/dL — ABNORMAL HIGH (ref 0.3–1.2)
Total Protein: 7.4 g/dL (ref 6.5–8.1)

## 2022-09-01 LAB — CBC WITH DIFFERENTIAL/PLATELET
Abs Immature Granulocytes: 0.01 10*3/uL (ref 0.00–0.07)
Basophils Absolute: 0 10*3/uL (ref 0.0–0.1)
Basophils Relative: 0 %
Eosinophils Absolute: 0 10*3/uL (ref 0.0–0.5)
Eosinophils Relative: 0 %
HCT: 45.1 % (ref 36.0–46.0)
Hemoglobin: 14.5 g/dL (ref 12.0–15.0)
Immature Granulocytes: 0 %
Lymphocytes Relative: 36 %
Lymphs Abs: 1.6 10*3/uL (ref 0.7–4.0)
MCH: 27.4 pg (ref 26.0–34.0)
MCHC: 32.2 g/dL (ref 30.0–36.0)
MCV: 85.1 fL (ref 80.0–100.0)
Monocytes Absolute: 0.5 10*3/uL (ref 0.1–1.0)
Monocytes Relative: 11 %
Neutro Abs: 2.4 10*3/uL (ref 1.7–7.7)
Neutrophils Relative %: 53 %
Platelets: 223 10*3/uL (ref 150–400)
RBC: 5.3 MIL/uL — ABNORMAL HIGH (ref 3.87–5.11)
RDW: 13.1 % (ref 11.5–15.5)
WBC: 4.6 10*3/uL (ref 4.0–10.5)
nRBC: 0 % (ref 0.0–0.2)

## 2022-09-01 LAB — LIPASE, BLOOD: Lipase: 24 U/L (ref 11–51)

## 2022-09-01 LAB — MAGNESIUM: Magnesium: 2.2 mg/dL (ref 1.7–2.4)

## 2022-09-01 MED ORDER — ONDANSETRON 8 MG PO TBDP
8.0000 mg | ORAL_TABLET | Freq: Once | ORAL | Status: AC
  Administered 2022-09-01: 8 mg via ORAL
  Filled 2022-09-01: qty 1

## 2022-09-01 MED ORDER — LACTATED RINGERS IV BOLUS
1000.0000 mL | Freq: Once | INTRAVENOUS | Status: AC
  Administered 2022-09-01: 1000 mL via INTRAVENOUS

## 2022-09-01 MED ORDER — SODIUM CHLORIDE 0.9 % IV SOLN
25.0000 mg | Freq: Four times a day (QID) | INTRAVENOUS | Status: DC | PRN
  Administered 2022-09-01: 25 mg via INTRAVENOUS
  Filled 2022-09-01: qty 25

## 2022-09-01 MED ORDER — PROMETHAZINE HCL 25 MG RE SUPP: 25.0000 mg | Freq: Four times a day (QID) | RECTAL | 0 refills | Status: DC | PRN

## 2022-09-01 NOTE — ED Provider Notes (Signed)
[place] EMERGENCY DEPARTMENT AT [facility] Provider Note   CSN: [id number] Arrival date & time: 09/01/22  5638     History  Chief Complaint  Patient presents with   Weakness   Nausea    Sherry Robbins is a 27 y.o. female.  HPI     27 year old female comes in with chief complaint of nausea, weakness.  Patient has history of HIV.  She was seen in the emergency room few days back with nausea, vomiting and diarrhea.  She had CT scan that revealed colitis.  Patient was discharged with Augmentin.  She states that since her discharge, she continues to have nausea and vomiting.  She was unable to keep her medications down.  She has dizziness with ambulation and still feels weak, therefore she decided to come to the ER.  Patient is having subjective fevers, and denies any diarrhea.  Additionally patient indicates that she also has this neck mass that has been present for about 3 months.  It is not growing, but she wants it to be checked out.  She has not seen her PCP.  She has not seen her ID doctor recently.  She has no trouble swallowing, no change in voice.  Home Medications Prior to Admission medications   Medication Sig Start Date End Date Taking? Authorizing Provider  promethazine (PHENERGAN) 25 MG suppository Place 1 suppository (25 mg total) rectally every 6 (six) hours as needed for nausea or vomiting. 09/01/22  Yes Derwood Kaplan, MD  amoxicillin-clavulanate (AUGMENTIN) 875-125 MG tablet Take 1 tablet by mouth every 12 (twelve) hours for 7 days. 08/31/22 09/07/22  Alvira Monday, MD  ODEFSEY 200-25-25 MG TABS tablet TAKE 1 TABLET BY MOUTH DAILY. NO FURTHER REFILLS UNTIL SEEN IN OFFICE 08/31/22   Odette Fraction, MD  ondansetron (ZOFRAN-ODT) 4 MG disintegrating tablet Take 4 mg by mouth every 8 (eight) hours as needed for nausea or vomiting. 08/29/22   [provider]  ondansetron (ZOFRAN-ODT) 4 MG disintegrating tablet Take 1 tablet (4 mg total) by mouth  every 8 (eight) hours as needed for nausea or vomiting. 08/31/22   Alvira Monday, MD      Allergies    Ceftriaxone, Cephalexin, and Zithromax [azithromycin]    Review of Systems   Review of Systems  All other systems reviewed and are negative.   Physical Exam Updated Vital Signs BP (!) 136/91 (BP Location: Left Arm)   Pulse 62   Temp 98.8 F (37.1 C) (Oral)   Resp 15   Ht 5\' 4"  (1.626 m)   Wt 51.3 kg   SpO2 100%   BMI 19.40 kg/m  Physical Exam Vitals and nursing note reviewed.  Constitutional:      Appearance: She is well-developed.  HENT:     Head: Atraumatic.  Cardiovascular:     Rate and Rhythm: Normal rate.  Pulmonary:     Effort: Pulmonary effort is normal.  Musculoskeletal:     Cervical back: Neck supple.  Skin:    General: Skin is warm and dry.  Neurological:     Mental Status: She is alert and oriented to person, place, and time.     ED Results / Procedures / Treatments   Labs (all labs ordered are listed, but only abnormal results are displayed) Labs Reviewed  CBC WITH DIFFERENTIAL/PLATELET - Abnormal; Notable for the following components:      Result Value   RBC 5.30 (*)    All other components within normal limits  COMPREHENSIVE METABOLIC  PANEL - Abnormal; Notable for the following components:   Total Bilirubin 2.3 (*)    All other components within normal limits  URINALYSIS, ROUTINE W REFLEX MICROSCOPIC - Abnormal; Notable for the following components:   APPearance HAZY (*)    Ketones, ur 80 (*)    All other components within normal limits  LIPASE, BLOOD  MAGNESIUM    EKG EKG Interpretation Date/Time:  Tuesday September 01 2022 07:37:32 EDT Ventricular Rate:  62 PR Interval:  128 QRS Duration:  89 QT Interval:  394 QTC Calculation: 401 R Axis:   91  Text Interpretation: Sinus rhythm Borderline right axis deviation No significant change since last tracing Confirmed by Gwyneth Sprout ([id number]) on 09/01/2022 7:47:42 AM  Radiology CT  ABDOMEN PELVIS W CONTRAST  Result Date: 08/31/2022 CLINICAL DATA:  Abdominal pain, nausea, vomiting, fever. EXAM: CT ABDOMEN AND PELVIS WITH CONTRAST TECHNIQUE: Multidetector CT imaging of the abdomen and pelvis was performed using the standard protocol following bolus administration of intravenous contrast. RADIATION DOSE REDUCTION: This exam was performed according to the departmental dose-optimization program which includes automated exposure control, adjustment of the mA and/or kV according to patient size and/or use of iterative reconstruction technique. CONTRAST:  70mL OMNIPAQUE IOHEXOL 300 MG/ML  SOLN COMPARISON:  None Available. FINDINGS: Lower chest: No acute abnormality. Hepatobiliary: No focal liver abnormality is seen. Small layering stone within the otherwise normal-appearing gallbladder. No bile duct dilatation is seen. Pancreas: Unremarkable. No pancreatic ductal dilatation or surrounding inflammatory changes. Spleen: Normal in size without focal abnormality. Adrenals/Urinary Tract: Adrenal glands appear normal. Kidneys appear normal without mass, stone or hydronephrosis. No ureteral or bladder calculi are appreciated. Bladder is unremarkable. Stomach/Bowel: No dilated large or small bowel loops. At least mild thickening of the walls of the descending colon and transverse colon. Stomach is unremarkable. Appendix is normal. Vascular/Lymphatic: No abdominal aortic aneurysm. No acute-appearing vascular abnormality. No enlarged lymph nodes are seen in the abdomen or pelvis. Reproductive: Uterus and bilateral adnexa are unremarkable. Other: No free fluid or abscess collection. No free intraperitoneal air. Musculoskeletal: Visualized osseous structures of the abdomen and pelvis are unremarkable. IMPRESSION: 1. At least mild thickening of the walls of the descending colon and transverse colon, suggesting a mild colitis of infectious or inflammatory nature. 2. No bowel obstruction. No evidence of acute  solid organ abnormality. No renal or ureteral calculi. Appendix is normal. 3. Cholelithiasis without evidence of acute cholecystitis. Electronically Signed   By: Bary Richard M.D.   On: 08/31/2022 14:31    Procedures Procedures    Medications Ordered in ED Medications  promethazine (PHENERGAN) 25 mg in sodium chloride 0.9 % 50 mL IVPB (0 mg Intravenous Stopped 09/01/22 1218)  lactated ringers bolus 1,000 mL (0 mLs Intravenous Stopped 09/01/22 1153)  ondansetron (ZOFRAN-ODT) disintegrating tablet 8 mg (8 mg Oral Given 09/01/22 1426)    ED Course/ Medical Decision Making/ A&P Clinical Course as of 09/01/22 1515  Tue Sep 01, 2022  1512 Pt reassessed. Pt's VSS and WNL. Pt's cap refill < 3 seconds. Pt has been hydrated in the ER and now passed po challenge. We will discharge with antiemetic. Strict ER return precautions have been discussed and pt will return if he is unable to tolerate fluids and symptoms are getting worse.  [AN]    Clinical Course User Index [AN] Derwood Kaplan, MD  Medical Decision Making Amount and/or Complexity of Data Reviewed Labs: ordered.  Risk Prescription drug management.   27 year old female comes in with chief complaint of persistent nausea, vomiting.  Patient was seen yesterday for nausea and vomiting and was found to have colitis.  She was prescribed Augmentin.  On exam, patient has no evidence of profound dehydration.  I also evaluated her neck given that she is complaining of mass, there is no thyromegaly that is concerning.  Patient does have diffuse lymphadenopathy.  Differential diagnosis considered for the neck lesion is reactive lymphadenopathy, lymphoma, thyroid nodule.  Given that the symptoms have been present for 3 months, there is no change in it, I advised that she follow-up with her PCP for optimal workup.  I do not think CT scan of her neck or ultrasound of the neck emergently is indicated.  Patient is  comfortable with this plan.  For her persistent nausea and vomiting, I have reviewed the CT scan from yesterday.  It reveals colitis.  Differential diagnosis effectively is electrolyte abnormality, AKI, ileus.  Will get basic labs again and reassess.  Will focus on symptom control.  Final Clinical Impression(s) / ED Diagnoses Final diagnoses:  Dehydration  Nausea and vomiting, unspecified vomiting type    Rx / DC Orders ED Discharge Orders          Ordered    promethazine (PHENERGAN) 25 MG suppository  Every 6 hours PRN        09/01/22 1510              Derwood Kaplan, MD 09/01/22 1527

## 2022-09-01 NOTE — ED Provider Notes (Signed)
[name] EMERGENCY DEPARTMENT AT [facility] Provider Note   CSN: [id number] Arrival date & time: 09/01/22  4098     History  Chief Complaint  Patient presents with   Weakness   Nausea    Sherry Robbins is a 27 y.o. female.  HPI    27 year old female comes in with chief complaint of weakness and nausea.  Patient has history of HIV that is well-controlled.  Patient has been having nausea and vomiting for the last 3 or 4 days.  She had 3 episodes of emesis yesterday.  She feels nauseous throughout.  Patient denies diarrhea at this time.  She has not had a bowel movement today.  Review of system is positive for subjective fevers.  Patient's review of system is positive for dizziness.   Home Medications Prior to Admission medications   Medication Sig Start Date End Date Taking? Authorizing Provider  promethazine (PHENERGAN) 25 MG suppository Place 1 suppository (25 mg total) rectally every 6 (six) hours as needed for nausea or vomiting. 09/01/22  Yes Derwood Kaplan, MD  amoxicillin-clavulanate (AUGMENTIN) 875-125 MG tablet Take 1 tablet by mouth every 12 (twelve) hours for 7 days. 08/31/22 09/07/22  Alvira Monday, MD  ODEFSEY 200-25-25 MG TABS tablet TAKE 1 TABLET BY MOUTH DAILY. NO FURTHER REFILLS UNTIL SEEN IN OFFICE 08/31/22   Odette Fraction, MD  ondansetron (ZOFRAN-ODT) 4 MG disintegrating tablet Take 4 mg by mouth every 8 (eight) hours as needed for nausea or vomiting. 08/29/22   [provider]  ondansetron (ZOFRAN-ODT) 4 MG disintegrating tablet Take 1 tablet (4 mg total) by mouth every 8 (eight) hours as needed for nausea or vomiting. 08/31/22   Alvira Monday, MD      Allergies    Ceftriaxone, Cephalexin, and Zithromax [azithromycin]    Review of Systems   Review of Systems  All other systems reviewed and are negative.   Physical Exam Updated Vital Signs BP (!) 136/91 (BP Location: Left Arm)   Pulse 62   Temp 98.8 F (37.1 C) (Oral)    Resp 15   Ht 5\' 4"  (1.626 m)   Wt 51.3 kg   SpO2 100%   BMI 19.40 kg/m  Physical Exam Vitals and nursing note reviewed.  Constitutional:      Appearance: She is well-developed.  HENT:     Head: Atraumatic.  Eyes:     Extraocular Movements: Extraocular movements intact.     Pupils: Pupils are equal, round, and reactive to light.  Cardiovascular:     Rate and Rhythm: Normal rate.  Pulmonary:     Effort: Pulmonary effort is normal.  Musculoskeletal:     Cervical back: Normal range of motion and neck supple.  Skin:    General: Skin is warm and dry.  Neurological:     Mental Status: She is alert and oriented to person, place, and time.     ED Results / Procedures / Treatments   Labs (all labs ordered are listed, but only abnormal results are displayed) Labs Reviewed  CBC WITH DIFFERENTIAL/PLATELET - Abnormal; Notable for the following components:      Result Value   RBC 5.30 (*)    All other components within normal limits  COMPREHENSIVE METABOLIC PANEL - Abnormal; Notable for the following components:   Total Bilirubin 2.3 (*)    All other components within normal limits  URINALYSIS, ROUTINE W REFLEX MICROSCOPIC - Abnormal; Notable for the following components:   APPearance HAZY (*)  Ketones, ur 80 (*)    All other components within normal limits  LIPASE, BLOOD  MAGNESIUM    EKG EKG Interpretation Date/Time:  Tuesday September 01 2022 07:37:32 EDT Ventricular Rate:  62 PR Interval:  128 QRS Duration:  89 QT Interval:  394 QTC Calculation: 401 R Axis:   91  Text Interpretation: Sinus rhythm Borderline right axis deviation No significant change since last tracing Confirmed by Gwyneth Sprout ([id number]) on 09/01/2022 7:47:42 AM  Radiology CT ABDOMEN PELVIS W CONTRAST  Result Date: 08/31/2022 CLINICAL DATA:  Abdominal pain, nausea, vomiting, fever. EXAM: CT ABDOMEN AND PELVIS WITH CONTRAST TECHNIQUE: Multidetector CT imaging of the abdomen and pelvis was performed  using the standard protocol following bolus administration of intravenous contrast. RADIATION DOSE REDUCTION: This exam was performed according to the departmental dose-optimization program which includes automated exposure control, adjustment of the mA and/or kV according to patient size and/or use of iterative reconstruction technique. CONTRAST:  70mL OMNIPAQUE IOHEXOL 300 MG/ML  SOLN COMPARISON:  None Available. FINDINGS: Lower chest: No acute abnormality. Hepatobiliary: No focal liver abnormality is seen. Small layering stone within the otherwise normal-appearing gallbladder. No bile duct dilatation is seen. Pancreas: Unremarkable. No pancreatic ductal dilatation or surrounding inflammatory changes. Spleen: Normal in size without focal abnormality. Adrenals/Urinary Tract: Adrenal glands appear normal. Kidneys appear normal without mass, stone or hydronephrosis. No ureteral or bladder calculi are appreciated. Bladder is unremarkable. Stomach/Bowel: No dilated large or small bowel loops. At least mild thickening of the walls of the descending colon and transverse colon. Stomach is unremarkable. Appendix is normal. Vascular/Lymphatic: No abdominal aortic aneurysm. No acute-appearing vascular abnormality. No enlarged lymph nodes are seen in the abdomen or pelvis. Reproductive: Uterus and bilateral adnexa are unremarkable. Other: No free fluid or abscess collection. No free intraperitoneal air. Musculoskeletal: Visualized osseous structures of the abdomen and pelvis are unremarkable. IMPRESSION: 1. At least mild thickening of the walls of the descending colon and transverse colon, suggesting a mild colitis of infectious or inflammatory nature. 2. No bowel obstruction. No evidence of acute solid organ abnormality. No renal or ureteral calculi. Appendix is normal. 3. Cholelithiasis without evidence of acute cholecystitis. Electronically Signed   By: Bary Richard M.D.   On: 08/31/2022 14:31    Procedures Procedures     Medications Ordered in ED Medications  promethazine (PHENERGAN) 25 mg in sodium chloride 0.9 % 50 mL IVPB (0 mg Intravenous Stopped 09/01/22 1218)  lactated ringers bolus 1,000 mL (0 mLs Intravenous Stopped 09/01/22 1153)  ondansetron (ZOFRAN-ODT) disintegrating tablet 8 mg (8 mg Oral Given 09/01/22 1426)    ED Course/ Medical Decision Making/ A&P Clinical Course as of 09/01/22 1513  Tue Sep 01, 2022  1512 Pt reassessed. Pt's VSS and WNL. Pt's cap refill < 3 seconds. Pt has been hydrated in the ER and now passed po challenge. We will discharge with antiemetic. Strict ER return precautions have been discussed and pt will return if he is unable to tolerate fluids and symptoms are getting worse.  [AN]    Clinical Course User Index [AN] Derwood Kaplan, MD                                 Medical Decision Making Amount and/or Complexity of Data Reviewed Labs: ordered.  Risk Prescription drug management.   27 year old patient comes into the ER with chief complaint of weakness, nausea and difficulty tolerating p.o.  Differential diagnosis for  her includes colitis, dehydration, severe electrolyte abnormality, renal failure.  She has history of HIV.  But last CD4 count over 500. Denies any blood in the stools, subjective fevers noted.  No blood in the emesis in the emesis nonbilious.  Basic labs ordered.  Final Clinical Impression(s) / ED Diagnoses Final diagnoses:  Dehydration  Nausea and vomiting, unspecified vomiting type    Rx / DC Orders ED Discharge Orders          Ordered    promethazine (PHENERGAN) 25 MG suppository  Every 6 hours PRN        09/01/22 1510              Derwood Kaplan, MD 09/01/22 1513

## 2022-09-01 NOTE — ED Triage Notes (Signed)
Pt returns to ED after dx of colitis yesterday, c/o worsening weakness and nausea since last Thursday with inability to tolerate PO intake. Pt reports feeling of near syncope while at rest. Also c/o random onset of episodic SOB. In NAD, VSS in triage.

## 2022-09-01 NOTE — Discharge Instructions (Addendum)
As discussed, please ensure that your diet is simple. Continue to take Zofran as needed for nausea and vomiting.  Promethazine is a medication you take if Zofran is not helping.  Additionally, return to the ER if you start having fevers, chills, severe nausea, vomiting, inability to keep any medications down.  See your primary care doctor about neck lesion.

## 2022-12-03 ENCOUNTER — Telehealth: Payer: Self-pay

## 2022-12-03 NOTE — Telephone Encounter (Signed)
Called [facility] to schedule follow up appointment, no answer. Left HIPAA compliant voicemail requesting callback.   Sandie Ano, RN

## 2023-02-02 ENCOUNTER — Other Ambulatory Visit: Payer: Medicaid Other

## 2023-02-02 ENCOUNTER — Other Ambulatory Visit: Payer: Self-pay | Admitting: Infectious Diseases

## 2023-02-02 ENCOUNTER — Other Ambulatory Visit: Payer: Self-pay

## 2023-02-02 DIAGNOSIS — B20 Human immunodeficiency virus [HIV] disease: Secondary | ICD-10-CM | POA: Diagnosis not present

## 2023-02-02 DIAGNOSIS — Z113 Encounter for screening for infections with a predominantly sexual mode of transmission: Secondary | ICD-10-CM

## 2023-02-02 DIAGNOSIS — Z5181 Encounter for therapeutic drug level monitoring: Secondary | ICD-10-CM | POA: Diagnosis not present

## 2023-02-03 LAB — C. TRACHOMATIS/N. GONORRHOEAE RNA
C. trachomatis RNA, TMA: NOT DETECTED
N. gonorrhoeae RNA, TMA: NOT DETECTED

## 2023-02-03 NOTE — L&D Delivery Note (Addendum)
 Patient: Sherry Robbins MRN: [id number]  GBS status: pos, IAP given no  Patient is a 28 y.o. now G5P4 s/p NSVD at [redacted]w[redacted]d, who was admitted for active labor. SROM 1h 39m prior to delivery with light mec fluid.    Delivery Note At 4:31 PM a viable female was delivered via Vaginal, Spontaneous (Presentation: Middle Occiput Anterior).  APGAR: 9, 9; weight  .   Placenta status: Spontaneous, Intact.  Cord: 3 vessels with the following complications: None.  Cord pH: n/a  Anesthesia: None Episiotomy: None Lacerations: None Suture Repair: n/a Est. Blood Loss (mL): 200  Mom to postpartum.  Baby to Couplet care / Skin to Skin.  Lynwood Solomons 12/25/2023, 4:44 PM     Head delivered LOA. No nuchal cord present. Shoulder and body delivered in usual fashion. Infant with spontaneous cry, placed on mother's abdomen, dried and bulb suctioned. Cord clamped x 2 after 1-minute delay, and cut by family member. Cord blood drawn. Placenta delivered spontaneously with gentle cord traction. Fundus firm with massage and Pitocin . Perineum inspected and found to have no  laceration.

## 2023-02-05 LAB — COMPLETE METABOLIC PANEL WITH GFR
AG Ratio: 1.6 (calc) (ref 1.0–2.5)
ALT: 19 U/L (ref 6–29)
AST: 13 U/L (ref 10–30)
Albumin: 4.2 g/dL (ref 3.6–5.1)
Alkaline phosphatase (APISO): 56 U/L (ref 31–125)
BUN: 11 mg/dL (ref 7–25)
CO2: 25 mmol/L (ref 20–32)
Calcium: 9.2 mg/dL (ref 8.6–10.2)
Chloride: 106 mmol/L (ref 98–110)
Creat: 0.84 mg/dL (ref 0.50–0.96)
Globulin: 2.7 g/dL (ref 1.9–3.7)
Glucose, Bld: 96 mg/dL (ref 65–99)
Potassium: 4.1 mmol/L (ref 3.5–5.3)
Sodium: 140 mmol/L (ref 135–146)
Total Bilirubin: 0.5 mg/dL (ref 0.2–1.2)
Total Protein: 6.9 g/dL (ref 6.1–8.1)
eGFR: 98 mL/min/{1.73_m2} (ref >=60)

## 2023-02-05 LAB — T-HELPER CELLS (CD4) COUNT (NOT AT ARMC)
Absolute CD4: 889 {cells}/uL (ref 490–1740)
CD4 T Helper %: 44 % (ref 30–61)
Total lymphocyte count: 2040 {cells}/uL (ref 850–3900)

## 2023-02-05 LAB — CBC WITH DIFFERENTIAL/PLATELET
Absolute Lymphocytes: 2048 {cells}/uL (ref 850–3900)
Absolute Monocytes: 544 {cells}/uL (ref 200–950)
Basophils Absolute: 38 {cells}/uL (ref 0–200)
Basophils Relative: 0.6 %
Eosinophils Absolute: 70 {cells}/uL (ref 15–500)
Eosinophils Relative: 1.1 %
HCT: 39.4 % (ref 35.0–45.0)
Hemoglobin: 12.8 g/dL (ref 11.7–15.5)
MCH: 27.6 pg (ref 27.0–33.0)
MCHC: 32.5 g/dL (ref 32.0–36.0)
MCV: 85.1 fL (ref 80.0–100.0)
MPV: 10.8 fL (ref 7.5–12.5)
Monocytes Relative: 8.5 %
Neutro Abs: 3699 {cells}/uL (ref 1500–7800)
Neutrophils Relative %: 57.8 %
Platelets: 217 10*3/uL (ref 140–400)
RBC: 4.63 10*6/uL (ref 3.80–5.10)
RDW: 14.3 % (ref 11.0–15.0)
Total Lymphocyte: 32 %
WBC: 6.4 10*3/uL (ref 3.8–10.8)

## 2023-02-05 LAB — LIPID PANEL
Cholesterol: 211 mg/dL — ABNORMAL HIGH (ref <200)
HDL: 81 mg/dL (ref >=50)
LDL Cholesterol (Calc): 109 mg/dL — ABNORMAL HIGH
Non-HDL Cholesterol (Calc): 130 mg/dL — ABNORMAL HIGH (ref <130)
Total CHOL/HDL Ratio: 2.6 (calc) (ref <5.0)
Triglycerides: 100 mg/dL (ref <150)

## 2023-02-05 LAB — HIV-1 RNA QUANT-NO REFLEX-BLD
HIV 1 RNA Quant: NOT DETECTED {copies}/mL
HIV-1 RNA Quant, Log: NOT DETECTED {Log_copies}/mL

## 2023-02-05 LAB — RPR: RPR Ser Ql: NONREACTIVE

## 2023-02-16 ENCOUNTER — Ambulatory Visit: Payer: Self-pay | Admitting: Infectious Diseases

## 2023-03-05 ENCOUNTER — Other Ambulatory Visit: Payer: Self-pay | Admitting: Infectious Diseases

## 2023-03-05 DIAGNOSIS — B20 Human immunodeficiency virus [HIV] disease: Secondary | ICD-10-CM

## 2023-03-07 ENCOUNTER — Other Ambulatory Visit: Payer: Self-pay | Admitting: Infectious Diseases

## 2023-03-07 DIAGNOSIS — B20 Human immunodeficiency virus [HIV] disease: Secondary | ICD-10-CM

## 2023-03-08 MED ORDER — ODEFSEY 200-25-25 MG PO TABS
1.0000 | ORAL_TABLET | Freq: Every day | ORAL | 0 refills | Status: DC
Start: 2023-03-08 — End: 2023-04-14

## 2023-03-08 NOTE — Telephone Encounter (Signed)
Patient called to follow up on message regarding refill request and overdue appt. Pt agreed to scheduling appt for 30 day temporary supply. Understands she will need to keep upcoming appt for additional refills.  Juanita Laster, RMA

## 2023-03-12 ENCOUNTER — Ambulatory Visit: Payer: Medicaid Other | Admitting: Internal Medicine

## 2023-04-06 ENCOUNTER — Other Ambulatory Visit: Payer: Self-pay | Admitting: Infectious Diseases

## 2023-04-06 DIAGNOSIS — B20 Human immunodeficiency virus [HIV] disease: Secondary | ICD-10-CM

## 2023-04-07 ENCOUNTER — Telehealth: Payer: Self-pay

## 2023-04-07 NOTE — Telephone Encounter (Signed)
 Front desk notified RN that patient has rescheduled appointment to 3/11 and is requesting refills of Odefsey. She has no-showed 3 appointments and was advised 2/3 that she would need to keep 2/7 appointment for additional fills. Last seen by provider 03/2022. Refills to be provided at 3/11 appointment.   Sandie Ano, RN

## 2023-04-08 NOTE — Telephone Encounter (Signed)
 Thanks Aundra Millet!

## 2023-04-12 NOTE — Progress Notes (Deleted)
 HPI: Sherry Robbins is a 28 y.o. female who presents to the [facility] pharmacy clinic for HIV follow-up.  Patient Active Problem List   Diagnosis Date Noted   Routine screening for STI (sexually transmitted infection) 03/05/2022   Medication monitoring encounter 03/05/2022   Syphilis 03/05/2022   Immunization counseling 03/05/2022   Pregnancy and infectious disease in third trimester 12/21/2018   Encounter for management and injection of injectable progestin contraceptive 05/03/2018   Adverse drug reaction 03/23/2018   HIV disease (HCC) 03/27/2015   Anemia 05/05/2014    Patient's Medications  New Prescriptions   No medications on file  Previous Medications   EMTRICITABINE-RILPIVIR-TENOFOVIR AF (ODEFSEY) 200-25-25 MG TABS TABLET    Take 1 tablet by mouth daily. Will need to keep upcoming appt for refills.   ONDANSETRON (ZOFRAN-ODT) 4 MG DISINTEGRATING TABLET    Take 4 mg by mouth every 8 (eight) hours as needed for nausea or vomiting.   ONDANSETRON (ZOFRAN-ODT) 4 MG DISINTEGRATING TABLET    Take 1 tablet (4 mg total) by mouth every 8 (eight) hours as needed for nausea or vomiting.   PROMETHAZINE (PHENERGAN) 25 MG SUPPOSITORY    Place 1 suppository (25 mg total) rectally every 6 (six) hours as needed for nausea or vomiting.  Modified Medications   No medications on file  Discontinued Medications   No medications on file    Labs: Lab Results  Component Value Date   HIV1RNAQUANT Not Detected 02/02/2023   HIV1RNAQUANT Not Detected 02/25/2022   HIV1RNAQUANT Not Detected 01/28/2021   CD4TABS 998 02/25/2022   CD4TABS 785 05/03/2019   CD4TABS 920 04/19/2018    RPR and STI Lab Results  Component Value Date   LABRPR NON-REACTIVE 02/02/2023   LABRPR REACTIVE (A) 02/25/2022   LABRPR REACTIVE (A) 01/28/2021   LABRPR NON-REACTIVE 12/07/2018   LABRPR REACTIVE (A) 08/26/2017   RPRTITER 1:1 (H) 02/25/2022   RPRTITER 1:1 (H) 01/28/2021   RPRTITER 1:1 (H) 08/26/2017    STI Results GC  CT  02/25/2022  9:08 AM Negative  Negative   08/26/2017 12:00 AM Negative  Negative   01/04/2016 12:00 AM Negative  Negative   11/25/2014 12:00 AM  Negative      04/15/2014 12:00 AM  Positive      04/04/2014 12:00 AM **NG: POSITIVE**  **CT: POSITIVE**    Negative         This result is from an external source.    Hepatitis B Lab Results  Component Value Date   HEPBSAB NON-REACTIVE 08/26/2017   HEPBSAG NON-REACTIVE 08/26/2017   HEPBCAB NON-REACTIVE 08/26/2017   Hepatitis C Lab Results  Component Value Date   HEPCAB NON-REACTIVE 08/26/2017   Hepatitis A Lab Results  Component Value Date   HAV REACTIVE (A) 08/26/2017   Lipids: Lab Results  Component Value Date   CHOL 211 (H) 02/02/2023   TRIG 100 02/02/2023   HDL 81 02/02/2023   CHOLHDL 2.6 02/02/2023   LDLCALC 109 (H) 02/02/2023    Current HIV Regimen: Odefsey  Assessment: Sherry Robbins is here today to follow up for her HIV infection. She has missed multiple appointments with Dr. Elinor Parkinson and was last seen on 03/05/22 where she had been out of her Odefsey x 4 days. 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 Last HIV RNA was undetectable in December 2024 and CD4 count was healthy at 889. Her fill record indicate that she has been filing Odefsey consistently without  interruption.   Plan: - Refill Odefsey - HIV RNA today - Follow up with *** in 6 months on ***  Keeven Matty L. Lawan Nanez, PharmD, BCIDP, AAHIVP, CPP Clinical Pharmacist Practitioner Infectious Diseases Clinical Pharmacist [facility] 04/12/2023, 3:00 PM

## 2023-04-13 ENCOUNTER — Ambulatory Visit: Admitting: Pharmacist

## 2023-04-13 DIAGNOSIS — B20 Human immunodeficiency virus [HIV] disease: Secondary | ICD-10-CM

## 2023-04-13 NOTE — Progress Notes (Signed)
 HPI: Sherry Robbins is a 28 y.o. female who presents to the [facility] pharmacy clinic for HIV follow-up.  Patient Active Problem List   Diagnosis Date Noted   Routine screening for STI (sexually transmitted infection) 03/05/2022   Medication monitoring encounter 03/05/2022   Syphilis 03/05/2022   Immunization counseling 03/05/2022   Pregnancy and infectious disease in third trimester 12/21/2018   Encounter for management and injection of injectable progestin contraceptive 05/03/2018   Adverse drug reaction 03/23/2018   HIV disease (HCC) 03/27/2015   Anemia 05/05/2014    Patient's Medications  New Prescriptions   No medications on file  Previous Medications   EMTRICITABINE-RILPIVIR-TENOFOVIR AF (ODEFSEY) 200-25-25 MG TABS TABLET    Take 1 tablet by mouth daily. Will need to keep upcoming appt for refills.   ONDANSETRON (ZOFRAN-ODT) 4 MG DISINTEGRATING TABLET    Take 4 mg by mouth every 8 (eight) hours as needed for nausea or vomiting.   ONDANSETRON (ZOFRAN-ODT) 4 MG DISINTEGRATING TABLET    Take 1 tablet (4 mg total) by mouth every 8 (eight) hours as needed for nausea or vomiting.   PROMETHAZINE (PHENERGAN) 25 MG SUPPOSITORY    Place 1 suppository (25 mg total) rectally every 6 (six) hours as needed for nausea or vomiting.  Modified Medications   No medications on file  Discontinued Medications   No medications on file    Labs: Lab Results  Component Value Date   HIV1RNAQUANT Not Detected 02/02/2023   HIV1RNAQUANT Not Detected 02/25/2022   HIV1RNAQUANT Not Detected 01/28/2021   CD4TABS 998 02/25/2022   CD4TABS 785 05/03/2019   CD4TABS 920 04/19/2018    RPR and STI Lab Results  Component Value Date   LABRPR NON-REACTIVE 02/02/2023   LABRPR REACTIVE (A) 02/25/2022   LABRPR REACTIVE (A) 01/28/2021   LABRPR NON-REACTIVE 12/07/2018   LABRPR REACTIVE (A) 08/26/2017   RPRTITER 1:1 (H) 02/25/2022   RPRTITER 1:1 (H) 01/28/2021   RPRTITER 1:1 (H) 08/26/2017    STI Results GC  CT  02/25/2022  9:08 AM Negative  Negative   08/26/2017 12:00 AM Negative  Negative   01/04/2016 12:00 AM Negative  Negative   11/25/2014 12:00 AM  Negative      04/15/2014 12:00 AM  Positive      04/04/2014 12:00 AM **NG: POSITIVE**  **CT: POSITIVE**    Negative         This result is from an external source.    Hepatitis B Lab Results  Component Value Date   HEPBSAB NON-REACTIVE 08/26/2017   HEPBSAG NON-REACTIVE 08/26/2017   HEPBCAB NON-REACTIVE 08/26/2017   Hepatitis C Lab Results  Component Value Date   HEPCAB NON-REACTIVE 08/26/2017   Hepatitis A Lab Results  Component Value Date   HAV REACTIVE (A) 08/26/2017   Lipids: Lab Results  Component Value Date   CHOL 211 (H) 02/02/2023   TRIG 100 02/02/2023   HDL 81 02/02/2023   CHOLHDL 2.6 02/02/2023   LDLCALC 109 (H) 02/02/2023    Current HIV Regimen: Odefsey  Assessment: Sherry Robbins is here today to follow up for her HIV infection. She has missed multiple appointments with Dr. Elinor Parkinson and was last seen on 03/05/22 where she had been out of her Odefsey x 4 days. 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 Last HIV RNA was undetectable in December 2024 and CD4 count was healthy at 889. Her fill record indicate that she has been filing Odefsey consistently without  interruption.   She has two jobs now and works both every day thus causing her to miss her appointments. She ran out of [place] about a week ago. She is not having any issues with it but is not taking it with a meal like required. Discussed switching to [facility] so that she does not have to worry about taking with a meal, and she is interested. Discussed what to expect with [facility] and how to take it. She will let me know if she does not tolerate it.  She is also worried about her cholesterol numbers from December. I discussed them with her and advised that her numbers are actually better than they have been in the  past. Discussed lifestyle modifications with her. She would like to try these and recheck her cholesterol at her next appointment with Dr. Elinor Parkinson in May. She can also have her viral load rechecked from the [place] switch.  No concerns or signs/symptoms of any STIs (no discharge, ulcers, rashes, or problems) but agrees to testing today. She is eligible for the flu, COVID, HPV, Menveo, and Prevnar20 vaccines but is not interested in getting any today. Will recheck her HIV viral load and have her see Dr. Elinor Parkinson in ~2 months. All questions and issues addressed.  Plan: - HIV RNA, RPR, and urine cytology today - Try lifestyle modifications (better diet and incorporating exercise) - Start Biktarvy PO once daily - Follow up with Dr. Elinor Parkinson on 06/14/23  Drinda Belgard L. Couper Juncaj, PharmD, BCIDP, AAHIVP, CPP Clinical Pharmacist Practitioner Infectious Diseases Clinical Pharmacist [facility] 04/13/2023, 4:42 PM

## 2023-04-14 ENCOUNTER — Other Ambulatory Visit (HOSPITAL_COMMUNITY)
Admission: RE | Admit: 2023-04-14 | Discharge: 2023-04-14 | Disposition: A | Discharge status: Home / Self Care | Source: Ambulatory Visit | Attending: Internal Medicine | Admitting: Internal Medicine

## 2023-04-14 ENCOUNTER — Ambulatory Visit (INDEPENDENT_AMBULATORY_CARE_PROVIDER_SITE_OTHER): Admitting: Pharmacist

## 2023-04-14 ENCOUNTER — Other Ambulatory Visit: Payer: Self-pay

## 2023-04-14 DIAGNOSIS — B20 Human immunodeficiency virus [HIV] disease: Secondary | ICD-10-CM | POA: Diagnosis not present

## 2023-04-14 DIAGNOSIS — Z113 Encounter for screening for infections with a predominantly sexual mode of transmission: Secondary | ICD-10-CM | POA: Insufficient documentation

## 2023-04-14 MED ORDER — BIKTARVY 50-200-25 MG PO TABS: 1.0000 | ORAL_TABLET | Freq: Every day | ORAL | 3 refills | Status: DC

## 2023-04-14 MED ORDER — ODEFSEY 200-25-25 MG PO TABS: 1.0000 | ORAL_TABLET | Freq: Every day | ORAL | 3 refills | Status: DC

## 2023-04-15 LAB — URINE CYTOLOGY ANCILLARY ONLY
Chlamydia: NEGATIVE
Comment: NEGATIVE
Comment: NORMAL
Neisseria Gonorrhea: NEGATIVE

## 2023-04-16 LAB — HIV-1 RNA QUANT-NO REFLEX-BLD
HIV 1 RNA Quant: NOT DETECTED {copies}/mL
HIV-1 RNA Quant, Log: NOT DETECTED {Log_copies}/mL

## 2023-04-16 LAB — RPR: RPR Ser Ql: NONREACTIVE

## 2023-05-18 ENCOUNTER — Other Ambulatory Visit (HOSPITAL_COMMUNITY): Payer: Self-pay

## 2023-05-26 ENCOUNTER — Inpatient Hospital Stay (HOSPITAL_COMMUNITY)
Admission: AD | Admit: 2023-05-26 | Discharge: 2023-05-27 | Disposition: A | Discharge status: Home / Self Care | Attending: Obstetrics and Gynecology | Admitting: Obstetrics and Gynecology

## 2023-05-26 DIAGNOSIS — O26899 Other specified pregnancy related conditions, unspecified trimester: Secondary | ICD-10-CM

## 2023-05-26 DIAGNOSIS — O26891 Other specified pregnancy related conditions, first trimester: Secondary | ICD-10-CM | POA: Insufficient documentation

## 2023-05-26 DIAGNOSIS — R109 Unspecified abdominal pain: Secondary | ICD-10-CM | POA: Insufficient documentation

## 2023-05-26 DIAGNOSIS — Z3A08 8 weeks gestation of pregnancy: Secondary | ICD-10-CM

## 2023-05-26 DIAGNOSIS — O219 Vomiting of pregnancy, unspecified: Secondary | ICD-10-CM | POA: Diagnosis not present

## 2023-05-26 DIAGNOSIS — N83291 Other ovarian cyst, right side: Secondary | ICD-10-CM | POA: Diagnosis not present

## 2023-05-26 DIAGNOSIS — O3680X0 Pregnancy with inconclusive fetal viability, not applicable or unspecified: Secondary | ICD-10-CM | POA: Diagnosis not present

## 2023-05-26 DIAGNOSIS — O3481 Maternal care for other abnormalities of pelvic organs, first trimester: Secondary | ICD-10-CM | POA: Diagnosis not present

## 2023-05-26 MED ORDER — FAMOTIDINE IN NACL 20-0.9 MG/50ML-% IV SOLN
20.0000 mg | Freq: Once | INTRAVENOUS | Status: AC
  Administered 2023-05-27: 20 mg via INTRAVENOUS
  Filled 2023-05-26: qty 50

## 2023-05-26 MED ORDER — ONDANSETRON HCL 4 MG/2ML IJ SOLN
4.0000 mg | Freq: Once | INTRAMUSCULAR | Status: AC
  Administered 2023-05-27: 4 mg via INTRAVENOUS
  Filled 2023-05-26: qty 2

## 2023-05-26 MED ORDER — LACTATED RINGERS IV SOLN: Freq: Once | INTRAVENOUS | Status: AC

## 2023-05-26 NOTE — MAU Provider Note (Signed)
 Chief Complaint: Emesis and Abdominal Pain   Event Date/Time   First Provider Initiated Contact with Patient 05/26/23 2345        SUBJECTIVE HPI: Sherry Robbins is a 28 y.o. [id number] at [redacted]w[redacted]d  by LMP who presents to maternity admissions reporting vomiting for several days.  Also having some abdominal cramping.  Will not elaborate on where in the abdomen.  Difficult to obtain history.  Patient curled up with eyes closed, difficult to obtain answers. She denies vaginal bleeding, or fever/chills.    Emesis  This is a new problem. The current episode started in the past 7 days. The problem occurs 2 to 4 times per day. There has been no fever. Associated symptoms include abdominal pain. Pertinent negatives include no chills, coughing, diarrhea or fever. She has tried nothing for the symptoms.  Abdominal Pain The current episode started today. The quality of the pain is cramping. Associated symptoms include nausea and vomiting. Pertinent negatives include no diarrhea or fever. The pain is relieved by Nothing. She has tried nothing for the symptoms.   RN Note: .SUMIKO LANZER is a 28 y.o. at Unknown here in MAU reporting: has not been able to keep anything down for the past couple days. Abdominal pain that began earlier today.  Reports she has been vomiting 5/day Had a positive pregnancy test last weekend.  Reports she is having chills, and feels like she has a fever  Pain Score: 8  Pain Location: Abdomen  Past Medical History:  Diagnosis Date   HIV disease (HCC) 06/2014   Tachycardia 10/2014   Past Surgical History:  Procedure Laterality Date   NO PAST SURGERIES     Social History   Socioeconomic History   Marital status: Single    Spouse name: Not on file   Number of children: Not on file   Years of education: Not on file   Highest education level: Not on file  Occupational History   Not on file  Tobacco Use   Smoking status: Former    Types: Cigars    Quit date: 08/30/2014     Years since quitting: 8.7   Smokeless tobacco: Never  Vaping Use   Vaping status: Never Used  Substance and Sexual Activity   Alcohol use: Yes    Comment: occ   Drug use: Yes    Types: Marijuana   Sexual activity: Not Currently    Partners: Male    Birth control/protection: Condom    Comment: declined condoms  Other Topics Concern   Not on file  Social History Narrative   Not on file   Social Drivers of Health   Financial Resource Strain: Not on file  Food Insecurity: Not on file  Transportation Needs: Not on file  Physical Activity: Not on file  Stress: Not on file  Social Connections: Unknown (06/17/2021)   Received from [facility]   Social Network    Social Network: Not on file  Intimate Partner Violence: Unknown (05/09/2021)   Received from [facility], [facility]   HITS    Physically Hurt: Not on file    Insult or Talk Down To: Not on file    Threaten Physical Harm: Not on file    Scream or Curse: Not on file   No current facility-administered medications on file prior to encounter.   Current Outpatient Medications on File Prior to Encounter  Medication Sig Dispense Refill   bictegravir-emtricitabine -tenofovir  AF (BIKTARVY ) 50-200-25 MG TABS tablet Take  1 tablet by mouth daily. 30 tablet 3   ondansetron  (ZOFRAN -ODT) 4 MG disintegrating tablet Take 4 mg by mouth every 8 (eight) hours as needed for nausea or vomiting.     ondansetron  (ZOFRAN -ODT) 4 MG disintegrating tablet Take 1 tablet (4 mg total) by mouth every 8 (eight) hours as needed for nausea or vomiting. 20 tablet 0   promethazine  (PHENERGAN ) 25 MG suppository Place 1 suppository (25 mg total) rectally every 6 (six) hours as needed for nausea or vomiting. 12 each 0   Allergies  Allergen Reactions   Ceftriaxone  Other (See Comments)    Reaction:  Fever and cytopenia   Cephalexin  Other (See Comments)    Reaction:  Fever and cytopenia   Zithromax  [Azithromycin ] Other (See Comments)     Reaction:  Fever and cytopenia    I have reviewed patient's Past Medical Hx, Surgical Hx, Family Hx, Social Hx, medications and allergies.   ROS:  Review of Systems  Constitutional:  Negative for chills and fever.  Respiratory:  Negative for cough.   Gastrointestinal:  Positive for abdominal pain, nausea and vomiting. Negative for diarrhea.   Review of Systems  Other systems negative   Physical Exam  Physical Exam Patient Vitals for the past 24 hrs:  BP Temp Temp src Pulse Resp SpO2 Height Weight  05/26/23 2339 109/63 97.6 F (36.4 C) Oral 79 17 100 % 5\' 4"  (1.626 m) 55.8 kg   Constitutional: Well-developed female in no acute distress. Difficult historian Cardiovascular: normal rate Respiratory: normal effort GI: Abd soft MS: Extremities nontender, no edema, normal ROM Neurologic: Alert and oriented x 4.  GU: Neg CVAT.  PELVIC EXAM: deferred in lieu of ultrasound  LAB RESULTS Results for orders placed or performed during the hospital encounter of 05/26/23 (from the past 24 hours)  CBC     Status: None   Collection Time: 05/27/23 12:54 AM  Result Value Ref Range   WBC 10.1 4.0 - 10.5 K/uL   RBC 4.86 3.87 - 5.11 MIL/uL   Hemoglobin 13.4 12.0 - 15.0 g/dL   HCT 40.9 81.1 - 91.4 %   MCV 82.9 80.0 - 100.0 fL   MCH 27.6 26.0 - 34.0 pg   MCHC 33.3 30.0 - 36.0 g/dL   RDW 78.2 95.6 - 21.3 %   Platelets 268 150 - 400 K/uL   nRBC 0.0 0.0 - 0.2 %  hCG, quantitative, pregnancy     Status: Abnormal   Collection Time: 05/27/23 12:54 AM  Result Value Ref Range   hCG, Beta Chain, Quant, S 95,410 (H) <5 mIU/mL  ABO/Rh     Status: None   Collection Time: 05/27/23 12:54 AM  Result Value Ref Range   ABO/RH(D) A POS    No rh immune globuloin      NOT A RH IMMUNE GLOBULIN CANDIDATE, PT RH POSITIVE Performed at [facility], [address], [place], [place] [address]   Wet prep, genital     Status: Abnormal   Collection Time: 05/27/23 12:54 AM  Result Value Ref Range    Yeast Wet Prep HPF POC PRESENT (A) NONE SEEN   Trich, Wet Prep NONE SEEN NONE SEEN   Clue Cells Wet Prep HPF POC NONE SEEN NONE SEEN   WBC, Wet Prep HPF POC >=10 (A) <10   Sperm NONE SEEN      IMAGING US  OB Comp Less 14 Wks Result Date: 05/27/2023 CLINICAL DATA:  Abdominal pain. EXAM: OBSTETRIC <14 WK US  AND TRANSVAGINAL  OB US  TECHNIQUE: Both transabdominal and transvaginal ultrasound examinations were performed for complete evaluation of the gestation as well as the maternal uterus, adnexal regions, and pelvic cul-de-sac. Transvaginal technique was performed to assess early pregnancy. COMPARISON:  None Available. FINDINGS: Intrauterine gestational sac: Single Yolk sac:  Visualized. Embryo:  Visualized. Cardiac Activity: Visualized. Heart Rate: 143 bpm CRL:  16.3 mm   8 w   0 d                  US  EDC: January 06, 2024 Subchorionic hemorrhage:  None visualized. Maternal uterus/adnexae: The right ovary measures 3.4 cm x 2.7 cm x 4.0 cm and contains a small simple cyst. The left ovary measures 3.9 cm x 1.9 cm x 2.5 cm and contains a small simple cyst. No pelvic free fluid is noted. IMPRESSION: Single, viable intrauterine pregnancy at approximately 8 weeks and 0 days gestation by ultrasound evaluation. Electronically Signed   By: Virgle Grime M.D.   On: 05/27/2023 02:37   MAU Management/MDM: I have reviewed the triage vital signs and the nursing notes.   Pertinent labs & imaging results that were available during my care of the patient were reviewed by me and considered in my medical decision making (see chart for details).      I have reviewed her medical records including past results, notes and treatments. Medical, Surgical, and family history were reviewed.  Medications and recent lab tests were reviewed  Ordered usual first trimester r/o ectopic labs.   Pelvic cultures done Will check baseline Ultrasound to rule out ectopic. Treatments in MAU included IV fluids, Zofran , Pepcid .   This  bleeding/pain can represent a normal pregnancy with bleeding, spontaneous abortion or even an ectopic which can be life-threatening.  The process as listed above helps to determine which of these is present.  She felt better after meds and was able to tolerate PO intake Discussed lab and US  findings.  ASSESSMENT Pregnancy at [redacted]w[redacted]d by LMP, [redacted]w[redacted]d by US  Nausea and vomiting Abdominal cramping Pregnancy of unknown location  PLAN Discharge home Rx Zofran  sent to pharmacy for nausea  Pt stable at time of discharge. Encouraged to return here if she develops worsening of symptoms, increase in pain, fever, or other concerning symptoms.    Holmes Lusher CNM, MSN Certified Nurse-Midwife 05/26/2023  11:45 PM

## 2023-05-26 NOTE — MAU Note (Signed)
 MAU Triage Note:  .Sherry Robbins is a 28 y.o. at Unknown here in MAU reporting: has not been able to keep anything down for the past couple days. Abdominal pain that began earlier today.  Reports she has been vomiting 5/day Had a positive pregnancy test last weekend.  Reports she is having chills, and feels like she has a fever  Pain Score: 8  Pain Location: Abdomen     LMP: 03/13/2023  Vitals:   05/26/23 2339  BP: 109/63  Pulse: 79  Resp: 17  Temp: 97.6 F (36.4 C)  SpO2: 100%     Lab orders placed from triage: poct pregnancy test

## 2023-05-27 ENCOUNTER — Inpatient Hospital Stay (HOSPITAL_COMMUNITY)

## 2023-05-27 ENCOUNTER — Encounter (HOSPITAL_COMMUNITY): Payer: Self-pay | Admitting: Obstetrics and Gynecology

## 2023-05-27 DIAGNOSIS — O3680X0 Pregnancy with inconclusive fetal viability, not applicable or unspecified: Secondary | ICD-10-CM

## 2023-05-27 DIAGNOSIS — O26891 Other specified pregnancy related conditions, first trimester: Secondary | ICD-10-CM

## 2023-05-27 DIAGNOSIS — Z3A08 8 weeks gestation of pregnancy: Secondary | ICD-10-CM

## 2023-05-27 DIAGNOSIS — R109 Unspecified abdominal pain: Secondary | ICD-10-CM

## 2023-05-27 DIAGNOSIS — O3481 Maternal care for other abnormalities of pelvic organs, first trimester: Secondary | ICD-10-CM | POA: Diagnosis not present

## 2023-05-27 DIAGNOSIS — O219 Vomiting of pregnancy, unspecified: Secondary | ICD-10-CM

## 2023-05-27 DIAGNOSIS — N83291 Other ovarian cyst, right side: Secondary | ICD-10-CM | POA: Diagnosis not present

## 2023-05-27 LAB — CBC
HCT: 40.3 % (ref 36.0–46.0)
Hemoglobin: 13.4 g/dL (ref 12.0–15.0)
MCH: 27.6 pg (ref 26.0–34.0)
MCHC: 33.3 g/dL (ref 30.0–36.0)
MCV: 82.9 fL (ref 80.0–100.0)
Platelets: 268 10*3/uL (ref 150–400)
RBC: 4.86 MIL/uL (ref 3.87–5.11)
RDW: 14.6 % (ref 11.5–15.5)
WBC: 10.1 10*3/uL (ref 4.0–10.5)
nRBC: 0 % (ref 0.0–0.2)

## 2023-05-27 LAB — WET PREP, GENITAL
Clue Cells Wet Prep HPF POC: NONE SEEN
Sperm: NONE SEEN
Trich, Wet Prep: NONE SEEN
WBC, Wet Prep HPF POC: >=10 — AB (ref <10)

## 2023-05-27 LAB — ABO/RH: ABO/RH(D): A POS

## 2023-05-27 LAB — HCG, QUANTITATIVE, PREGNANCY: hCG, Beta Chain, Quant, S: 95410 m[IU]/mL — ABNORMAL HIGH (ref <5)

## 2023-05-27 MED ORDER — ONDANSETRON 4 MG PO TBDP: 4.0000 mg | ORAL_TABLET | Freq: Four times a day (QID) | ORAL | 0 refills | Status: DC | PRN

## 2023-05-28 LAB — GC/CHLAMYDIA PROBE AMP (~~LOC~~) NOT AT ARMC
Chlamydia: NEGATIVE
Comment: NEGATIVE
Comment: NORMAL
Neisseria Gonorrhea: NEGATIVE

## 2023-06-14 ENCOUNTER — Ambulatory Visit (INDEPENDENT_AMBULATORY_CARE_PROVIDER_SITE_OTHER): Admitting: Infectious Diseases

## 2023-06-14 ENCOUNTER — Other Ambulatory Visit: Payer: Self-pay

## 2023-06-14 ENCOUNTER — Encounter: Payer: Self-pay | Admitting: Infectious Diseases

## 2023-06-14 ENCOUNTER — Other Ambulatory Visit (HOSPITAL_COMMUNITY)
Admission: RE | Admit: 2023-06-14 | Discharge: 2023-06-14 | Disposition: A | Discharge status: Home / Self Care | Source: Ambulatory Visit | Attending: Infectious Diseases | Admitting: Infectious Diseases

## 2023-06-14 VITALS — BP 106/76 | HR 113 | Temp 99.3°F | Ht 64.0 in | Wt 124.0 lb

## 2023-06-14 DIAGNOSIS — Z113 Encounter for screening for infections with a predominantly sexual mode of transmission: Secondary | ICD-10-CM | POA: Diagnosis not present

## 2023-06-14 DIAGNOSIS — B20 Human immunodeficiency virus [HIV] disease: Secondary | ICD-10-CM | POA: Insufficient documentation

## 2023-06-14 DIAGNOSIS — Z0000 Encounter for general adult medical examination without abnormal findings: Secondary | ICD-10-CM | POA: Insufficient documentation

## 2023-06-14 DIAGNOSIS — Z5181 Encounter for therapeutic drug level monitoring: Secondary | ICD-10-CM

## 2023-06-14 DIAGNOSIS — O98711 Human immunodeficiency virus [HIV] disease complicating pregnancy, first trimester: Secondary | ICD-10-CM | POA: Diagnosis not present

## 2023-06-14 DIAGNOSIS — Z3A11 11 weeks gestation of pregnancy: Secondary | ICD-10-CM | POA: Diagnosis not present

## 2023-06-14 MED ORDER — BIKTARVY 50-200-25 MG PO TABS: 1.0000 | ORAL_TABLET | Freq: Every day | ORAL | 10 refills | Status: AC

## 2023-06-14 NOTE — Addendum Note (Signed)
 Addended by: Terre Ferri on: 06/14/2023 07:11 PM   Modules accepted: Level of Service

## 2023-06-14 NOTE — Progress Notes (Signed)
 [id number], [place], [place], [address]                                                                  Phn. [phone]; Fax: [phone]                                                                             Date: 06/14/23  Reason for Visit: HIV follow up, lost to follow up   HPI: Sherry Robbins is a 28 y.o.old female with a history of HIV, well controlled, on Odefsey  ( diagnosed in 2016, previously on descovy /intelence>odefsey > Tivicay  and truvada in 2020/2021 during pregnancy then back to Odefsey ) who is here for fu. Last seen in Feb 2024  Seen in the ED few times since last visit for nausea, vomiting. She reports being pregnant  and following with OB and approx 11 WOG. Was seen by Pharmacy when she ran out of Odefsey  and was switched to Biktarvy  due to no need to take it with meal. She however reports taking Odefsey  currently but likes biktarvy  better. Discussed about switching back to Biktarvy  as a better option. No other concerns   ROS: Denies fevers, chills. Denies nausea, vomiting, abdominal pain, diarrhea/constipation, loss of appetite and weight loss. Denies SOB, cough and chest pain. Denies GU complaints. Denies recent hospitalizations. Denies rashes, joint complaints, headaches/neurological complaints  Current Outpatient Medications on File Prior to Visit  Medication Sig Dispense Refill   bictegravir-emtricitabine -tenofovir  AF (BIKTARVY ) 50-200-25 MG TABS tablet Take 1 tablet by mouth daily. 30 tablet 3   No current facility-administered medications on file prior to visit.                       Allergies  Allergen Reactions   Ceftriaxone  Other (See Comments)    Reaction:  Fever and cytopenia   Cephalexin  Other (See Comments)    Reaction:  Fever and cytopenia   Zithromax   [Azithromycin ] Other (See Comments)    Reaction:  Fever and cytopenia   Past Medical History:  Diagnosis Date   HIV disease (HCC) 06/2014   Tachycardia 10/2014   Past Surgical History:  Procedure Laterality Date   NO PAST SURGERIES     Social History   Socioeconomic History   Marital status: Single    Spouse name: Not on file   Number of children: Not on file   Years of education: Not on file   Highest education level: Not on file  Occupational History   Not on file  Tobacco Use   Smoking status: Former    Types: Cigars    Quit date:  08/30/2014    Years since quitting: 8.7   Smokeless tobacco: Never  Vaping Use   Vaping status: Never Used  Substance and Sexual Activity   Alcohol use: Not Currently    Comment: occ   Drug use: Not Currently    Types: Marijuana   Sexual activity: Yes    Partners: Male    Birth control/protection: Condom    Comment: declined condoms  Other Topics Concern   Not on file  Social History Narrative   Not on file   Social Drivers of Health   Financial Resource Strain: Not on file  Food Insecurity: Not on file  Transportation Needs: Not on file  Physical Activity: Not on file  Stress: Not on file  Social Connections: Unknown (06/17/2021)   Received from [facility]   Social Network    Social Network: Not on file  Intimate Partner Violence: Unknown (05/09/2021)   Received from [employer], [facility]   HITS    Physically Hurt: Not on file    Insult or Talk Down To: Not on file    Threaten Physical Harm: Not on file    Scream or Curse: Not on file   Family History  Problem Relation Age of Onset   Cancer Maternal Grandmother    Allergic rhinitis Mother    Food Allergy Mother    Asthma Neg Hx    Eczema Neg Hx    Urticaria Neg Hx   ' Vitals  BP 106/76   Pulse (!) 113   Temp 99.3 F (37.4 C) (Oral)   Ht 5\' 4"  (1.626 m)   Wt 124 lb (56.2 kg)   LMP 03/13/2023   SpO2 98%   BMI 21.28 kg/m   Examination   Gen: Alert and oriented x 3, no acute distress HEENT: [place]/AT, no scleral icterus, no pale conjunctivae, hearing normal, oral mucosa moist Neck: Supple Cardio: Regular rate and rhythm Resp: CTAB GI: nondistended GU: Musc: Extremities: No pedal ledema Skin: No rashes Neuro: grossly non focal  Psych: Calm, cooperative    Lab Results HIV 1 RNA Quant  Date Value  04/14/2023 NOT DETECTED copies/mL  02/02/2023 Not Detected Copies/mL  02/25/2022 Not Detected Copies/mL   CD4 T Cell Abs (/uL)  Date Value  02/25/2022 998  05/03/2019 785  04/19/2018 920   No results found for: "HIV1GENOSEQ" Lab Results  Component Value Date   WBC 10.1 05/27/2023   HGB 13.4 05/27/2023   HCT 40.3 05/27/2023   MCV 82.9 05/27/2023   PLT 268 05/27/2023    Lab Results  Component Value Date   CREATININE 0.84 02/02/2023   BUN 11 02/02/2023   NA 140 02/02/2023   K 4.1 02/02/2023   CL 106 02/02/2023   CO2 25 02/02/2023   Lab Results  Component Value Date   ALT 19 02/02/2023   AST 13 02/02/2023   ALKPHOS 45 09/01/2022   BILITOT 0.5 02/02/2023    Lab Results  Component Value Date   CHOL 211 (H) 02/02/2023   TRIG 100 02/02/2023   HDL 81 02/02/2023   LDLCALC 109 (H) 02/02/2023   Lab Results  Component Value Date   HAV REACTIVE (A) 08/26/2017   Lab Results  Component Value Date   HEPBSAG NON-REACTIVE 08/26/2017   HEPBSAB NON-REACTIVE 08/26/2017   No results found for: "HCVAB" Lab Results  Component Value Date   CHLAMYDIAWP Negative 05/26/2023   N Negative 05/26/2023   No results found for: "GCPROBEAPT" No results found for: "  QUANTGOLD"  Health Maintenance: Immunization History  Administered Date(s) Administered   DTaP 05/26/1995, 07/22/1995, 10/01/1995, 07/05/1996, 07/05/2000   HIB (PRP-T) 05/26/1995, 07/22/1995, 10/01/1995, 07/04/1996   Hepatitis A 07/16/2014   Hepatitis A, Ped/Adol-2 Dose 07/16/2014   Hepatitis B 02-01-96, 04/19/1995, 12/14/1995   Hepatitis B,  PED/ADOLESCENT Mar 01, 1995, 04/19/1995, 12/14/1995   IPV 05/26/1995, 07/22/1995, 07/04/1996, 07/05/2000   Influenza, Seasonal, Injecte, Preservative Fre 11/14/2014, 12/11/2015   Influenza,inj,Quad PF,6+ Mos 12/11/2015, 12/21/2018   Influenza-Unspecified 11/14/2014, 12/11/2015   MMR 07/04/1996, 07/05/2000   Meningococcal Conjugate 09/10/2017   Meningococcal Mcv4o 09/10/2017   Pneumococcal Conjugate-13 07/16/2014   Pneumococcal Polysaccharide-23 01/06/2016   Tdap 01/06/2016, 04/03/2019   Varicella 07/05/2000   Assessment/Plan: # HIV - Patient reports taking Odefsey  but was switched to Biktarvy  recently. Biktarvy  is a better option with no need to take with food. Recommend to switch back to Biktarvy .  - Fu in 3 months  # H/o Syphilis  # STD screening  - Prior h/o treatment - RPR and Urine GC   # Pregnancy  - Follows with OB  - If she were to start Iron, need to take Biktarvy  at least 2 hours before or 6 hours after oral iron preparations under fasting conditions OR can be administered concurrently with iron if taken with food.   # Immunization  - Deferred   # Health Maintenance: - Lipid panel   Patient's labs were reviewed as well as his previous records. Patients questions were addressed and answered. Safe sex counseling done.  I have personally spent 40 minutes involved in face-to-face and non-face-to-face activities for this patient on the day of the visit. Professional time spent includes the following activities: Preparing to see the patient (review of tests), Obtaining and/or reviewing separately obtained history (admission/discharge record), Performing a medically appropriate examination and/or evaluation , Ordering medications/tests/procedures, referring and communicating with other health care professionals, Documenting clinical information in the EMR, Independently interpreting results (not separately reported), Communicating results to the patient/family/caregiver,  Counseling and educating the patient/family/caregiver and Care coordination (not separately reported).    Electronically signed by:  Terre Ferri, MD Infectious Disease Physician [facility] [address] [place], [place] [address] Phone: [phone]  Fax: [phone]

## 2023-06-15 LAB — URINE CYTOLOGY ANCILLARY ONLY
Chlamydia: NEGATIVE
Comment: NEGATIVE
Comment: NORMAL
Neisseria Gonorrhea: NEGATIVE

## 2023-06-15 LAB — T-HELPER CELLS (CD4) COUNT (NOT AT ARMC)
CD4 % Helper T Cell: 48 % (ref 33–65)
CD4 T Cell Abs: 637 /uL (ref 400–1790)

## 2023-06-17 ENCOUNTER — Ambulatory Visit: Payer: Self-pay | Admitting: Infectious Diseases

## 2023-06-17 LAB — LIPID PANEL
Cholesterol: 260 mg/dL — ABNORMAL HIGH (ref <200)
HDL: 93 mg/dL (ref >=50)
LDL Cholesterol (Calc): 135 mg/dL — ABNORMAL HIGH
Non-HDL Cholesterol (Calc): 167 mg/dL — ABNORMAL HIGH (ref <130)
Total CHOL/HDL Ratio: 2.8 (calc) (ref <5.0)
Triglycerides: 187 mg/dL — ABNORMAL HIGH (ref <150)

## 2023-06-17 LAB — COMPREHENSIVE METABOLIC PANEL WITH GFR
AG Ratio: 1.4 (calc) (ref 1.0–2.5)
ALT: 10 U/L (ref 6–29)
AST: 12 U/L (ref 10–30)
Albumin: 4.4 g/dL (ref 3.6–5.1)
Alkaline phosphatase (APISO): 48 U/L (ref 31–125)
BUN/Creatinine Ratio: 9 (calc) (ref 6–22)
BUN: 6 mg/dL — ABNORMAL LOW (ref 7–25)
CO2: 26 mmol/L (ref 20–32)
Calcium: 9.7 mg/dL (ref 8.6–10.2)
Chloride: 101 mmol/L (ref 98–110)
Creat: 0.67 mg/dL (ref 0.50–0.96)
Globulin: 3.2 g/dL (ref 1.9–3.7)
Glucose, Bld: 78 mg/dL (ref 65–99)
Potassium: 3.8 mmol/L (ref 3.5–5.3)
Sodium: 138 mmol/L (ref 135–146)
Total Bilirubin: 0.4 mg/dL (ref 0.2–1.2)
Total Protein: 7.6 g/dL (ref 6.1–8.1)
eGFR: 122 mL/min/{1.73_m2} (ref >=60)

## 2023-06-17 LAB — HIV RNA, RTPCR W/R GT (RTI, PI,INT)
HIV 1 RNA Quant: NOT DETECTED {copies}/mL
HIV-1 RNA Quant, Log: NOT DETECTED {Log_copies}/mL

## 2023-06-17 LAB — RPR: RPR Ser Ql: NONREACTIVE

## 2023-06-21 ENCOUNTER — Encounter (HOSPITAL_BASED_OUTPATIENT_CLINIC_OR_DEPARTMENT_OTHER): Payer: Self-pay | Admitting: Certified Nurse Midwife

## 2023-06-21 ENCOUNTER — Telehealth (HOSPITAL_BASED_OUTPATIENT_CLINIC_OR_DEPARTMENT_OTHER): Payer: Self-pay

## 2023-06-21 ENCOUNTER — Encounter (HOSPITAL_BASED_OUTPATIENT_CLINIC_OR_DEPARTMENT_OTHER): Admitting: Certified Nurse Midwife

## 2023-06-21 ENCOUNTER — Encounter (HOSPITAL_BASED_OUTPATIENT_CLINIC_OR_DEPARTMENT_OTHER)

## 2023-06-21 DIAGNOSIS — O09299 Supervision of pregnancy with other poor reproductive or obstetric history, unspecified trimester: Secondary | ICD-10-CM | POA: Insufficient documentation

## 2023-06-21 NOTE — Telephone Encounter (Signed)
 Patient was scheduled to come in for a nurse OB visit and a New OB visit on with Hezzie Loupe, on 06/21/23. Tried to call patient and left a voicemail for her to call our office to discuss rescheduling her missed appointments.

## 2023-06-22 ENCOUNTER — Encounter: Payer: Self-pay | Admitting: Infectious Diseases

## 2023-06-22 ENCOUNTER — Ambulatory Visit (INDEPENDENT_AMBULATORY_CARE_PROVIDER_SITE_OTHER): Admitting: Infectious Diseases

## 2023-06-22 ENCOUNTER — Other Ambulatory Visit: Payer: Self-pay

## 2023-06-22 VITALS — BP 116/82 | HR 121 | Temp 99.0°F | Wt 122.0 lb

## 2023-06-22 DIAGNOSIS — Z3A11 11 weeks gestation of pregnancy: Secondary | ICD-10-CM | POA: Diagnosis not present

## 2023-06-22 DIAGNOSIS — B20 Human immunodeficiency virus [HIV] disease: Secondary | ICD-10-CM

## 2023-06-22 NOTE — Progress Notes (Signed)
 [address], [place], [place], [address]                                                                  Phn. [phone]; Fax: [phone]                                                                             Date: 06/22/23  Reason for Visit:paper work   HPI: Sherry Robbins is a 28 y.o.old female with a history of HIV, well controlled, on Odefsey  ( diagnosed in 2016, previously on descovy /intelence>odefsey > Tivicay  and truvada in 2020/2021 during pregnancy then back to Odefsey ) who is here for fu. Last seen in Feb 2024  Seen in the ED few times since last visit for nausea, vomiting. She reports being pregnant  and following with OB and approx 11 WOG. Was seen by Pharmacy when she ran out of Odefsey  and was switched to Biktarvy  due to no need to take it with meal. She however reports taking Odefsey  currently but likes biktarvy  better. Discussed about switching back to Biktarvy  as a better option. No other concerns   5/20 She is here to fill disability paperwork in the setting of being absent from work since mid April due to health issues. She has been taking Biktarvy  as recommended without concerns. Next appt with OBG is 6/12 and reports she has no PCP to fill up the paperwork. No other complaints.   ROS: Denies fevers, chills. Denies nausea, vomiting, abdominal pain, diarrhea/constipation, loss of appetite and weight loss. Denies SOB, cough and chest pain. Denies GU complaints. Denies recent hospitalizations. Denies rashes, joint complaints, headaches/neurological complaints  Current Outpatient Medications on File Prior to Visit  Medication Sig Dispense Refill   [START ON 07/15/2023] bictegravir-emtricitabine -tenofovir  AF (BIKTARVY ) 50-200-25 MG TABS tablet Take 1 tablet by mouth daily. 30  tablet 10   No current facility-administered medications on file prior to visit.                       Allergies  Allergen Reactions   Ceftriaxone  Other (See Comments)    Reaction:  Fever and cytopenia   Cephalexin  Other (See Comments)    Reaction:  Fever and cytopenia   Zithromax  [Azithromycin ] Other (See Comments)    Reaction:  Fever and cytopenia   Past Medical History:  Diagnosis Date   HIV disease (HCC) 06/2014   Tachycardia 10/2014   Past Surgical History:  Procedure Laterality Date   NO PAST SURGERIES     Social History   Socioeconomic History   Marital status: Single    Spouse name: Not on  file   Number of children: Not on file   Years of education: Not on file   Highest education level: Not on file  Occupational History   Not on file  Tobacco Use   Smoking status: Former    Types: Cigars    Quit date: 08/30/2014    Years since quitting: 8.8   Smokeless tobacco: Never  Vaping Use   Vaping status: Never Used  Substance and Sexual Activity   Alcohol use: Not Currently    Comment: occ   Drug use: Not Currently    Types: Marijuana   Sexual activity: Yes    Partners: Male    Birth control/protection: Condom    Comment: declined condoms  Other Topics Concern   Not on file  Social History Narrative   Not on file   Social Drivers of Health   Financial Resource Strain: Not on file  Food Insecurity: Not on file  Transportation Needs: Not on file  Physical Activity: Not on file  Stress: Not on file  Social Connections: Unknown (06/17/2021)   [facility] from [facility]   Social Network    Social Network: Not on file  Intimate Partner Violence: Unknown (05/09/2021)   [facility] from [facility]   HITS    Physically Hurt: Not on file    Insult or Talk Down To: Not on file    Threaten Physical Harm: Not on file    Scream or Curse: Not on file   Family History  Problem Relation Age of Onset   Cancer Maternal Grandmother     Allergic rhinitis Mother    Food Allergy Mother    Asthma Neg Hx    Eczema Neg Hx    Urticaria Neg Hx   ' Vitals  BP 116/82   Pulse (!) 121   Temp 99 F (37.2 C) (Oral)   Wt 122 lb (55.3 kg)   LMP 03/13/2023   SpO2 98%   BMI 20.94 kg/m   Examination  Gen: Alert and oriented x 3, no acute distress HEENT: Primera/AT, no scleral icterus, no pale conjunctivae, hearing normal, oral mucosa moist Neck: Supple Cardio: Regular rate and rhythm Resp: CTAB GI: nondistended GU: Musc: Extremities: No pedal ledema Skin: No rashes Neuro: grossly non focal  Psych: Calm, cooperative    Lab Results HIV 1 RNA Quant  Date Value  06/14/2023 NOT DETECTED copies/mL  04/14/2023 NOT DETECTED copies/mL  02/02/2023 Not Detected Copies/mL   CD4 T Cell Abs (/uL)  Date Value  06/14/2023 637  02/25/2022 998  05/03/2019 785   No results found for: "HIV1GENOSEQ" Lab Results  Component Value Date   WBC 10.1 05/27/2023   HGB 13.4 05/27/2023   HCT 40.3 05/27/2023   MCV 82.9 05/27/2023   PLT 268 05/27/2023    Lab Results  Component Value Date   CREATININE 0.67 06/14/2023   BUN 6 (L) 06/14/2023   NA 138 06/14/2023   K 3.8 06/14/2023   CL 101 06/14/2023   CO2 26 06/14/2023   Lab Results  Component Value Date   ALT 10 06/14/2023   AST 12 06/14/2023   ALKPHOS 45 09/01/2022   BILITOT 0.4 06/14/2023    Lab Results  Component Value Date   CHOL 260 (H) 06/14/2023   TRIG 187 (H) 06/14/2023   HDL 93 06/14/2023   LDLCALC 135 (H) 06/14/2023   Lab Results  Component Value Date   HAV REACTIVE (A) 08/26/2017   Lab Results  Component  Value Date   HEPBSAG NON-REACTIVE 08/26/2017   HEPBSAB NON-REACTIVE 08/26/2017   No results found for: "HCVAB" Lab Results  Component Value Date   CHLAMYDIAWP Negative 06/14/2023   N Negative 06/14/2023   No results found for: "GCPROBEAPT" No results found for: "QUANTGOLD"  Health Maintenance: Immunization History  Administered Date(s) Administered    DTaP 05/26/1995, 07/22/1995, 10/01/1995, 07/05/1996, 07/05/2000   HIB (PRP-T) 05/26/1995, 07/22/1995, 10/01/1995, 07/04/1996   Hepatitis A 07/16/2014   Hepatitis A, Ped/Adol-2 Dose 07/16/2014   Hepatitis B 04-22-95, 04/19/1995, 12/14/1995   Hepatitis B, PED/ADOLESCENT 1995/09/20, 04/19/1995, 12/14/1995   IPV 05/26/1995, 07/22/1995, 07/04/1996, 07/05/2000   Influenza, Seasonal, Injecte, Preservative Fre 11/14/2014, 12/11/2015   Influenza,inj,Quad PF,6+ Mos 12/11/2015, 12/21/2018   Influenza-Unspecified 11/14/2014, 12/11/2015   MMR 07/04/1996, 07/05/2000   Meningococcal Conjugate 09/10/2017   Meningococcal Mcv4o 09/10/2017   Pneumococcal Conjugate-13 07/16/2014   Pneumococcal Polysaccharide-23 01/06/2016   Tdap 01/06/2016, 04/03/2019   Varicella 07/05/2000   Assessment/Plan: # HIV - continue Biktarvy . She is not on MVs/Fe but aware about DDI with Biktarvy   - Recent labs discussed  - Fu as planned   # Paperwork  - Filled paper work regarding her recent health issues causing absence from work   # Pregnancy  - Follows with OB, next appt 6/12   Patient's labs were reviewed as well as his previous records. Patients questions were addressed and answered. Safe sex counseling done.  I have personally spent 20 minutes involved in face-to-face and non-face-to-face activities for this patient on the day of the visit. Professional time spent includes the following activities: Preparing to see the patient (review of tests), Performing a medically appropriate examination and evaluation , filling up paper work,  Documenting clinical information in the EMR, Independently interpreting results (not separately reported), Communicating results to the patient, Counseling and educating the patient and Care coordination (not separately reported).    Electronically signed by:  Terre Ferri, MD Infectious Disease Physician [facility] [address] [place], [place] [address] Phone: [phone]  Fax: [phone]

## 2023-06-22 NOTE — Progress Notes (Signed)
 Disability papers completed by provider. Copy given to patient, original placed in triage. Patient states she will send forms into [place].   Isis Costanza, BSN, RN

## 2023-07-05 ENCOUNTER — Ambulatory Visit: Admitting: Infectious Diseases

## 2023-09-16 ENCOUNTER — Ambulatory Visit: Admitting: Infectious Diseases

## 2023-10-22 ENCOUNTER — Encounter: Payer: Self-pay | Admitting: Infectious Diseases

## 2023-10-22 DIAGNOSIS — Z3A29 29 weeks gestation of pregnancy: Secondary | ICD-10-CM

## 2023-11-17 ENCOUNTER — Other Ambulatory Visit: Payer: Self-pay

## 2023-11-17 ENCOUNTER — Ambulatory Visit: Admitting: Certified Nurse Midwife

## 2023-11-17 ENCOUNTER — Encounter: Payer: Self-pay | Admitting: Certified Nurse Midwife

## 2023-11-17 VITALS — BP 124/69 | HR 104 | Wt 150.1 lb

## 2023-11-17 DIAGNOSIS — O26893 Other specified pregnancy related conditions, third trimester: Secondary | ICD-10-CM

## 2023-11-17 DIAGNOSIS — O09893 Supervision of other high risk pregnancies, third trimester: Secondary | ICD-10-CM

## 2023-11-17 DIAGNOSIS — Z8759 Personal history of other complications of pregnancy, childbirth and the puerperium: Secondary | ICD-10-CM

## 2023-11-17 DIAGNOSIS — O0990 Supervision of high risk pregnancy, unspecified, unspecified trimester: Secondary | ICD-10-CM | POA: Diagnosis not present

## 2023-11-17 DIAGNOSIS — O0933 Supervision of pregnancy with insufficient antenatal care, third trimester: Secondary | ICD-10-CM

## 2023-11-17 DIAGNOSIS — Z23 Encounter for immunization: Secondary | ICD-10-CM | POA: Diagnosis not present

## 2023-11-17 DIAGNOSIS — O0993 Supervision of high risk pregnancy, unspecified, third trimester: Secondary | ICD-10-CM | POA: Diagnosis not present

## 2023-11-17 DIAGNOSIS — R12 Heartburn: Secondary | ICD-10-CM | POA: Diagnosis not present

## 2023-11-17 DIAGNOSIS — O98713 Human immunodeficiency virus [HIV] disease complicating pregnancy, third trimester: Secondary | ICD-10-CM | POA: Diagnosis not present

## 2023-11-17 DIAGNOSIS — Z3A32 32 weeks gestation of pregnancy: Secondary | ICD-10-CM

## 2023-11-17 DIAGNOSIS — O98719 Human immunodeficiency virus [HIV] disease complicating pregnancy, unspecified trimester: Secondary | ICD-10-CM

## 2023-11-17 MED ORDER — OMEPRAZOLE 40 MG PO CPDR: 40.0000 mg | DELAYED_RELEASE_CAPSULE | Freq: Every day | ORAL | 3 refills | Status: AC

## 2023-11-17 NOTE — Progress Notes (Signed)
 History:   Sherry Robbins is a 28 y.o. [phone] at [redacted]w[redacted]d by sure LMP being seen today for her first obstetrical visit (late to care).  Her obstetrical history is significant for intrauterine growth restriction (IUGR) and HIV (undetectable)/syphilis (treated). Patient does not intend to breast feed. Pregnancy history fully reviewed.  Patient reports heartburn.      HISTORY: OB History  Gravida Para Term Preterm AB Living  5 3 1 2 1 3   SAB IAB Ectopic Multiple Live Births  0 1 0 0 3    # Outcome Date GA Lbr Len/2nd Weight Sex Type Anes PTL Lv  5 Current           4 IAB 2022          3 Term 06/12/19 [redacted]w[redacted]d 00:58 / 00:12 4 lb 14.3 oz (2.22 kg) M Vag-Spont  N LIV     Complications: IUGR (intrauterine growth restriction) affecting care of mother, Intrauterine growth restriction (IUGR) affecting care of mother     Name: LAIKYNN, POLLIO     Apgar1: 7  Apgar5: 9  2 Preterm 01/04/16 [redacted]w[redacted]d 07:42 / 00:18 5 lb 1.3 oz (2.305 kg) M Vag-Spont EPI  LIV     Name: CONLEIGH, HEINLEIN     Apgar1: 8  Apgar5: 9  1 Preterm 03/27/15 [redacted]w[redacted]d 19:03 / 01:56 4 lb 5.1 oz (1.96 kg) M Vag-Spont EPI  LIV     Name: ANJOLI, DIEMER     Apgar1: 3  Apgar5: 7    Last pap smear was done 02/11/18 and was normal  Past Medical History:  Diagnosis Date   HIV disease (HCC) 06/2014   Tachycardia 10/2014   History reviewed. No pertinent surgical history. Family History  Problem Relation Age of Onset   Allergic rhinitis Mother    Food Allergy Mother    Cancer Maternal Grandmother    Pancreatic cancer Maternal Grandfather    Social History   Tobacco Use   Smoking status: Former    Types: Cigars    Quit date: 08/30/2014    Years since quitting: 9.2   Smokeless tobacco: Never  Vaping Use   Vaping status: Never Used  Substance Use Topics   Alcohol use: Not Currently   Drug use: Not Currently    Types: Marijuana   Allergies  Allergen Reactions   Ceftriaxone  Other (See Comments)    Reaction:  Fever and  cytopenia   Cephalexin  Other (See Comments)    Reaction:  Fever and cytopenia   Zithromax  [Azithromycin ] Other (See Comments)    Reaction:  Fever and cytopenia   Current Outpatient Medications on File Prior to Visit  Medication Sig Dispense Refill   bictegravir-emtricitabine -tenofovir  AF (BIKTARVY ) 50-200-25 MG TABS tablet Take 1 tablet by mouth daily. 30 tablet 10   Prenatal Vit-Fe Fumarate-FA (MULTIVITAMIN-PRENATAL) 27-0.8 MG TABS tablet Take 1 tablet by mouth daily at 12 noon.     No current facility-administered medications on file prior to visit.    Review of Systems Pertinent items noted in HPI and remainder of comprehensive ROS otherwise negative. Physical Exam:   Vitals:   11/17/23 1440  BP: 124/69  Pulse: (!) 104  Weight: 150 lb 1.6 oz (68.1 kg)   Fetal Heart Rate (bpm): 155  Constitutional: Well-developed, well-nourished pregnant female in no acute distress.  HEENT: PERRLA Skin: normal color and turgor, no rash Cardiovascular: normal rate & rhythm, warm and well perfused Respiratory: normal effort, no problems with respiration noted GI: Abd soft, non-distended MS:  Extremities nontender, no edema, normal ROM Neurologic: Alert and oriented x 4.  GU: no CVA tenderness Pelvic: exam declined, will get pap and postpartum visit  Assessment:   Pregnancy: [id number] Patient Active Problem List   Diagnosis Date Noted   Supervision of high risk pregnancy, antepartum 11/17/2023   IUGR (intrauterine growth restriction) in prior pregnancy, pregnant 06/21/2023   HIV disease (HCC) 06/14/2023   Syphilis 03/05/2022   Pregnancy and infectious disease in third trimester 12/21/2018   Adverse drug reaction 03/23/2018   Anemia 05/05/2014     Plan:  1. Supervision of high risk pregnancy, antepartum (Primary) - Doing well, feeling regular and vigorous fetal movement  - US  MFM OB DETAIL +14 WK; Future - CBC/D/Plt+RPR+Rh+ABO+RubIgG... - HgB A1c - Flu vaccine trivalent PF, 6mos and  older(Flulaval,Afluria,Fluarix,Fluzone) - Tdap vaccine greater than or equal to 7yo IM  2. [redacted] weeks gestation of pregnancy - Routine OB care  - Glucose Tolerance, 1 Hour  3. HIV affecting pregnancy, antepartum, unspecified trimester - Undetectable on Biktarvy , being seen by ID  4. 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 History of prior pregnancy with small for gestational age newborn - MFM booked out for a month, requested asap appt per Dr. Arna given her history  6. Heartburn during pregnancy in third trimester - omeprazole (PRILOSEC) 40 MG capsule; Take 1 capsule (40 mg total) by mouth daily.  Dispense: 30 capsule; Refill: 3  7. Initial obstetric visit in third trimester  - Initial labs drawn. - Continue prenatal vitamins. - Problem list reviewed and updated. - Genetic Screening discussed, Quad screen and NIPS: ordered. - Ultrasound discussed; fetal anatomic survey: ordered. - Anticipatory guidance about prenatal visits given including labs, ultrasounds, and testing. - Discussed usage of Babyscripts and virtual visits as additional source of managing and completing prenatal visits in midst of coronavirus and pandemic.   - Encouraged to complete MyChart Registration for her ability to review results, send requests, and have questions addressed.  - The nature of [place] [facility]ulty Practice with multiple MDs and Advanced Practice Providers was explained to patient; also emphasized that residents, students are part of our team. - Routine obstetric precautions reviewed. Encouraged to seek out care at office or emergency room [facility] preferred) for urgent and/or emergent concerns.  Return in about 2 weeks (around 12/01/2023) for IN-PERSON, HOB.    Future Appointments  Date Time Provider Department Center  11/26/2023  8:35 AM Ilean Norleen GAILS, MD [facility]  12/07/2023  8:15 AM Nicholaus Burnard HERO, MD [facility]  12/13/2023 11:15 AM Nicholaus Burnard HERO, MD [facility]  12/20/2023  8:15 AM Cleatus Moccasin, MD [facility]  12/27/2023  2:35 PM Nicholaus Burnard HERO, MD [facility]  01/03/2024  1:15 PM Eldonna Suzen Octave, MD [facility]    Cornell Finder, MSN, CNM, IBCLC Certified Nurse Midwife, [facility]

## 2023-11-17 NOTE — Progress Notes (Deleted)
 [facility]

## 2023-11-19 LAB — HEMOGLOBIN A1C
Est. average glucose Bld gHb Est-mCnc: 120 mg/dL
Hgb A1c MFr Bld: 5.8 % — ABNORMAL HIGH (ref 4.8–5.6)

## 2023-11-19 LAB — CBC/D/PLT+RPR+RH+ABO+RUBIGG...
Basophils Absolute: 0 x10E3/uL (ref 0.0–0.2)
Basos: 0 %
EOS (ABSOLUTE): 0.1 x10E3/uL (ref 0.0–0.4)
Eos: 1 %
HCV Ab: NONREACTIVE
HIV Screen 4th Generation wRfx: REACTIVE
Hematocrit: 31.4 % — ABNORMAL LOW (ref 34.0–46.6)
Hemoglobin: 9.8 g/dL — ABNORMAL LOW (ref 11.1–15.9)
Hepatitis B Surface Ag: NEGATIVE
Immature Grans (Abs): 0 x10E3/uL (ref 0.0–0.1)
Immature Granulocytes: 0 %
Lymphocytes Absolute: 1.1 x10E3/uL (ref 0.7–3.1)
Lymphs: 16 %
MCH: 23.1 pg — ABNORMAL LOW (ref 26.6–33.0)
MCHC: 31.2 g/dL — ABNORMAL LOW (ref 31.5–35.7)
MCV: 74 fL — ABNORMAL LOW (ref 79–97)
Monocytes Absolute: 0.4 x10E3/uL (ref 0.1–0.9)
Monocytes: 6 %
Neutrophils Absolute: 5.4 x10E3/uL (ref 1.4–7.0)
Neutrophils: 77 %
Platelets: 254 x10E3/uL (ref 150–450)
RBC: 4.24 x10E6/uL (ref 3.77–5.28)
RDW: 16.3 % — ABNORMAL HIGH (ref 11.7–15.4)
RPR Ser Ql: NONREACTIVE
Rh Factor: POSITIVE
Rubella Antibodies, IGG: 7.03 {index} (ref >0.99)
WBC: 7 x10E3/uL (ref 3.4–10.8)

## 2023-11-19 LAB — HIV 1/2 AB DIFFERENTIATION
HIV 1 Ab: REACTIVE
HIV 2 Ab: NONREACTIVE
NOTE (HIV CONF MULTIP: POSITIVE — AB

## 2023-11-19 LAB — HCV INTERPRETATION

## 2023-11-19 LAB — AB SCR+ANTIBODY ID: Antibody Screen: POSITIVE — AB

## 2023-11-19 LAB — GLUCOSE TOLERANCE, 1 HOUR: Glucose, 1Hr PP: 144 mg/dL (ref 70–199)

## 2023-11-20 LAB — URINE CULTURE, OB REFLEX

## 2023-11-20 LAB — CULTURE, OB URINE

## 2023-11-23 LAB — PANORAMA PRENATAL TEST FULL PANEL:PANORAMA TEST PLUS 5 ADDITIONAL MICRODELETIONS: FETAL FRACTION: 16.4

## 2023-11-24 DIAGNOSIS — R7309 Other abnormal glucose: Secondary | ICD-10-CM | POA: Insufficient documentation

## 2023-11-24 DIAGNOSIS — O0930 Supervision of pregnancy with insufficient antenatal care, unspecified trimester: Secondary | ICD-10-CM | POA: Insufficient documentation

## 2023-11-25 LAB — HORIZON CUSTOM: REPORT SUMMARY: POSITIVE — AB

## 2023-11-26 ENCOUNTER — Encounter: Payer: Self-pay | Admitting: Family Medicine

## 2023-11-26 ENCOUNTER — Other Ambulatory Visit: Payer: Self-pay

## 2023-11-26 ENCOUNTER — Ambulatory Visit: Admitting: Family Medicine

## 2023-11-26 VITALS — BP 108/72 | HR 112 | Wt 155.6 lb

## 2023-11-26 DIAGNOSIS — O0993 Supervision of high risk pregnancy, unspecified, third trimester: Secondary | ICD-10-CM | POA: Diagnosis not present

## 2023-11-26 DIAGNOSIS — O0990 Supervision of high risk pregnancy, unspecified, unspecified trimester: Secondary | ICD-10-CM

## 2023-11-26 DIAGNOSIS — Z8759 Personal history of other complications of pregnancy, childbirth and the puerperium: Secondary | ICD-10-CM | POA: Diagnosis not present

## 2023-11-26 DIAGNOSIS — O98713 Human immunodeficiency virus [HIV] disease complicating pregnancy, third trimester: Secondary | ICD-10-CM

## 2023-11-26 DIAGNOSIS — O09893 Supervision of other high risk pregnancies, third trimester: Secondary | ICD-10-CM

## 2023-11-26 DIAGNOSIS — Z3A34 34 weeks gestation of pregnancy: Secondary | ICD-10-CM | POA: Diagnosis not present

## 2023-11-26 DIAGNOSIS — O98719 Human immunodeficiency virus [HIV] disease complicating pregnancy, unspecified trimester: Secondary | ICD-10-CM

## 2023-11-26 MED ORDER — FERROUS SULFATE 325 (65 FE) MG PO TBEC
325.0000 mg | DELAYED_RELEASE_TABLET | ORAL | 2 refills | Status: AC
Start: 2023-11-26 — End: 2024-05-24

## 2023-11-26 MED ORDER — NITROFURANTOIN MONOHYD MACRO 100 MG PO CAPS: 100.0000 mg | ORAL_CAPSULE | Freq: Two times a day (BID) | ORAL | 0 refills | Status: DC

## 2023-11-26 NOTE — Progress Notes (Signed)
   PRENATAL VISIT NOTE  Subjective:  Sherry Robbins is a 28 y.o. [id number] at [redacted]w[redacted]d being seen today for ongoing prenatal care.  She is currently monitored for the following issues for this high-risk pregnancy and has Pregnancy and infectious disease in third trimester; Syphilis; HIV disease (HCC); IUGR (intrauterine growth restriction) in prior pregnancy, pregnant; Supervision of high risk pregnancy, antepartum; Late prenatal care affecting pregnancy; and Elevated hemoglobin A1c on their problem list.  Patient reports no bleeding, no contractions, no cramping, and no leaking.  Contractions: Not present. Vag. Bleeding: None.  Movement: Present. Denies leaking of fluid.   The following portions of the patient's history were reviewed and updated as appropriate: allergies, current medications, past family history, past medical history, past social history, past surgical history and problem list.   Objective:    Vitals:   11/26/23 0852  BP: 108/72  Pulse: (!) 112  Weight: 155 lb 9.6 oz (70.6 kg)    Fetal Status:  Fetal Heart Rate (bpm): 152   Movement: Present    General: Alert, oriented and cooperative. Patient is in no acute distress.  Skin: Skin is warm and dry. No rash noted.   Cardiovascular: Normal heart rate noted  Respiratory: Normal respiratory effort, no problems with respiration noted  Abdomen: Soft, gravid, appropriate for gestational age.  Pain/Pressure: Absent     Pelvic: Cervical exam deferred        Extremities: Normal range of motion.  Edema: None  Mental Status: Normal mood and affect. Normal behavior. Normal judgment and thought content.   Assessment and Plan:  Pregnancy: [id number] at [redacted]w[redacted]d 1. Supervision of high risk pregnancy, antepartum (Primary) FHR BP appropriate today UTI noted on urine culture, sent prescription for Macrobid  2. HIV affecting pregnancy, antepartum, unspecified trimester Currently on Biktarvy  Patient reports she has been undetectable for  several years but no recent viral load.  Has not seen infectious disease since May. - HIV 1 RNA quant-no reflex-bld  3. 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 History of prior pregnancy with small for gestational age newborn  56. [redacted] weeks gestation of pregnancy  Preterm labor symptoms and general obstetric precautions including but not limited to vaginal bleeding, contractions, leaking of fluid and fetal movement were reviewed in detail with the patient. Please refer to After Visit Summary for other counseling recommendations.   No follow-ups on file.  Future Appointments  Date Time Provider Department Center  12/03/2023  8:00 AM [facility] PROVIDER 1 [facility]  12/03/2023  8:30 AM [facility] US3 [facility]  12/07/2023  8:15 AM Nicholaus Burnard HERO, MD [facility]  12/13/2023 11:15 AM Nicholaus Burnard HERO, MD [facility]  12/20/2023  8:15 AM Cleatus Moccasin, MD [facility]  12/27/2023  2:35 PM Nicholaus Burnard HERO, MD [facility]  01/03/2024  1:15 PM Eldonna Suzen Octave, MD [facility]    Norleen LULLA Rover, MD

## 2023-11-28 LAB — HIV-1 RNA QUANT-NO REFLEX-BLD: HIV-1 RNA Viral Load: <20 {copies}/mL

## 2023-12-03 ENCOUNTER — Ambulatory Visit (HOSPITAL_BASED_OUTPATIENT_CLINIC_OR_DEPARTMENT_OTHER): Admitting: Obstetrics

## 2023-12-03 ENCOUNTER — Ambulatory Visit: Attending: Obstetrics and Gynecology

## 2023-12-03 ENCOUNTER — Other Ambulatory Visit: Payer: Self-pay | Admitting: Certified Nurse Midwife

## 2023-12-03 VITALS — BP 140/68 | HR 98

## 2023-12-03 DIAGNOSIS — R7309 Other abnormal glucose: Secondary | ICD-10-CM | POA: Insufficient documentation

## 2023-12-03 DIAGNOSIS — Z3A35 35 weeks gestation of pregnancy: Secondary | ICD-10-CM | POA: Insufficient documentation

## 2023-12-03 DIAGNOSIS — Z21 Asymptomatic human immunodeficiency virus [HIV] infection status: Secondary | ICD-10-CM | POA: Diagnosis not present

## 2023-12-03 DIAGNOSIS — O09293 Supervision of pregnancy with other poor reproductive or obstetric history, third trimester: Secondary | ICD-10-CM | POA: Diagnosis not present

## 2023-12-03 DIAGNOSIS — O0933 Supervision of pregnancy with insufficient antenatal care, third trimester: Secondary | ICD-10-CM | POA: Diagnosis present

## 2023-12-03 DIAGNOSIS — B20 Human immunodeficiency virus [HIV] disease: Secondary | ICD-10-CM | POA: Diagnosis not present

## 2023-12-03 DIAGNOSIS — O09213 Supervision of pregnancy with history of pre-term labor, third trimester: Secondary | ICD-10-CM | POA: Diagnosis not present

## 2023-12-03 DIAGNOSIS — O09299 Supervision of pregnancy with other poor reproductive or obstetric history, unspecified trimester: Secondary | ICD-10-CM | POA: Insufficient documentation

## 2023-12-03 DIAGNOSIS — O98713 Human immunodeficiency virus [HIV] disease complicating pregnancy, third trimester: Secondary | ICD-10-CM | POA: Diagnosis not present

## 2023-12-03 DIAGNOSIS — O0990 Supervision of high risk pregnancy, unspecified, unspecified trimester: Secondary | ICD-10-CM

## 2023-12-03 NOTE — Progress Notes (Signed)
 MFM Consult Note  Sherry Robbins is currently at 35 weeks and 0 days.  She was seen as she presented late for prenatal care and maternal HIV.    She recently failed her 1 hour glucose challenge test and will have a 3-hour glucose tolerance test performed soon.    Her prior pregnancy was complicated by IUGR.    She is currently treated with Biktarvy  and her most recent HIV viral load was undetectable.    Her Horizon screening test showed that she is a silent carrier for alpha thalassemia.  Her cell free DNA test indicated a low risk for trisomy 21, 18, and 13. A female fetus is predicted.   Sonographic findings Single intrauterine pregnancy at 35w 0d  Fetal cardiac activity:  Observed and appears normal. Presentation: Cephalic. The views of the fetal anatomy were limited today due to her advanced gestational age.  What was visualized today appeared within normal limits. Fetal biometry shows the estimated fetal weight of 5 pounds 8 ounces which measures at the 37th percentile.  Amniotic fluid: Within normal limits.  AFI: 11.94 cm. MVP: 4.94 cm. Placenta: Posterior. BPP 8/8.  The patient was informed that anomalies may be missed due to technical limitations. If the fetus is in a suboptimal position or maternal habitus is increased, visualization of the fetus in the maternal uterus may be impaired.  HIV and pregnancy As her viral load is undetectable, she may attempt a vaginal delivery at term. Should her viral loads remain undetectable, she will not require AZT at the time of delivery. Invasive procedures such as placement of a fetal scalp electrode are not recommended.  Prior pregnancies complicated by IUGR The patient was reassured that the fetal growth today appeared within normal limits.   As the fetal growth is within normal limits, no further exams were scheduled in our office.    Delivery should be considered at around 39 weeks.    The patient stated that all of her questions  were answered today.  A total of 30 minutes was spent counseling and coordinating the care for this patient.  Greater than 50% of the time was spent in direct face-to-face contact.

## 2023-12-07 ENCOUNTER — Encounter: Payer: Self-pay | Admitting: Obstetrics and Gynecology

## 2023-12-07 ENCOUNTER — Other Ambulatory Visit: Payer: Self-pay

## 2023-12-07 ENCOUNTER — Ambulatory Visit: Admitting: Obstetrics and Gynecology

## 2023-12-07 ENCOUNTER — Other Ambulatory Visit (HOSPITAL_COMMUNITY)
Admission: RE | Admit: 2023-12-07 | Discharge: 2023-12-07 | Disposition: A | Discharge status: Home / Self Care | Source: Ambulatory Visit | Attending: Obstetrics and Gynecology | Admitting: Obstetrics and Gynecology

## 2023-12-07 VITALS — BP 115/69 | HR 108 | Wt 156.0 lb

## 2023-12-07 DIAGNOSIS — O0993 Supervision of high risk pregnancy, unspecified, third trimester: Secondary | ICD-10-CM

## 2023-12-07 DIAGNOSIS — Z9229 Personal history of other drug therapy: Secondary | ICD-10-CM

## 2023-12-07 DIAGNOSIS — O09293 Supervision of pregnancy with other poor reproductive or obstetric history, third trimester: Secondary | ICD-10-CM | POA: Diagnosis not present

## 2023-12-07 DIAGNOSIS — O0933 Supervision of pregnancy with insufficient antenatal care, third trimester: Secondary | ICD-10-CM | POA: Diagnosis not present

## 2023-12-07 DIAGNOSIS — A539 Syphilis, unspecified: Secondary | ICD-10-CM

## 2023-12-07 DIAGNOSIS — O0990 Supervision of high risk pregnancy, unspecified, unspecified trimester: Secondary | ICD-10-CM | POA: Insufficient documentation

## 2023-12-07 DIAGNOSIS — Z23 Encounter for immunization: Secondary | ICD-10-CM

## 2023-12-07 DIAGNOSIS — O09299 Supervision of pregnancy with other poor reproductive or obstetric history, unspecified trimester: Secondary | ICD-10-CM

## 2023-12-07 DIAGNOSIS — Z2911 Encounter for prophylactic immunotherapy for respiratory syncytial virus (RSV): Secondary | ICD-10-CM

## 2023-12-07 DIAGNOSIS — B20 Human immunodeficiency virus [HIV] disease: Secondary | ICD-10-CM | POA: Diagnosis not present

## 2023-12-07 DIAGNOSIS — Z3A35 35 weeks gestation of pregnancy: Secondary | ICD-10-CM | POA: Diagnosis not present

## 2023-12-07 NOTE — Progress Notes (Signed)
 PRENATAL VISIT NOTE  Subjective:  Sherry Robbins is a 28 y.o. [id number] at [redacted]w[redacted]d being seen today for ongoing prenatal care.  She is currently monitored for the following issues for this high-risk pregnancy and has Pregnancy and infectious disease in third trimester; Syphilis; HIV disease (HCC); IUGR (intrauterine growth restriction) in prior pregnancy, pregnant; Supervision of high risk pregnancy, antepartum; Late prenatal care affecting pregnancy; and Elevated hemoglobin A1c on their problem list.  Patient reports some pain in right buttocks and shoots down leg.  Contractions: Irritability. Vag. Bleeding: None.  Movement: Present. Denies leaking of fluid.   The following portions of the patient's history were reviewed and updated as appropriate: allergies, current medications, past family history, past medical history, past social history, past surgical history and problem list.   Objective:   Vitals:   12/07/23 0841  BP: 115/69  Pulse: (!) 108  Weight: 156 lb (70.8 kg)    Fetal Status:  Fetal Heart Rate (bpm): 160   Movement: Present    General: Alert, oriented and cooperative. Patient is in no acute distress.  Skin: Skin is warm and dry. No rash noted.   Cardiovascular: Normal heart rate noted  Respiratory: Normal respiratory effort, no problems with respiration noted  Abdomen: Soft, gravid, appropriate for gestational age.  Pain/Pressure: Present     Pelvic: Cervical exam deferred        Extremities: Normal range of motion.  Edema: None  Mental Status: Normal mood and affect. Normal behavior. Normal judgment and thought content.      11/17/2023    3:38 PM 03/05/2022   10:42 AM 12/21/2018    4:22 PM  Depression screen PHQ 2/9  Decreased Interest 2 0 0  Down, Depressed, Hopeless 0 1 0  PHQ - 2 Score 2 1 0  Altered sleeping 3    Tired, decreased energy 3    Change in appetite 1    Feeling bad or failure about yourself  0    Trouble concentrating 0    Moving slowly or  fidgety/restless 0    Suicidal thoughts 0    PHQ-9 Score 9          11/17/2023    3:39 PM  GAD 7 : Generalized Anxiety Score  Nervous, Anxious, on Edge 2  Control/stop worrying 1  Worry too much - different things 2  Trouble relaxing 1  Restless 0  Easily annoyed or irritable 1  Afraid - awful might happen 2  Total GAD 7 Score 9    Assessment and Plan:  Pregnancy: [id number] at [redacted]w[redacted]d  1. Syphilis (Primary) Txtd Jan 2024  2. HIV disease (HCC) Last VL 10/24 undetectable Reviewed delivery planning for IOL for 39 weeks  3. IUGR (intrauterine growth restriction) in prior pregnancy, pregnant Normal growth this pregnancy  4. Supervision of high risk pregnancy, antepartum Counseled regarding risks/benefits of RSV vaccine, patient accepts vaccine.   5. Late prenatal care affecting pregnancy in third trimester  6. [redacted] weeks gestation of pregnancy   Preterm labor symptoms and general obstetric precautions including but not limited to vaginal bleeding, contractions, leaking of fluid and fetal movement were reviewed in detail with the patient. Please refer to After Visit Summary for other counseling recommendations.   Return in about 1 week (around 12/14/2023) for high OB.  Future Appointments  Date Time Provider Department Center  12/13/2023 10:15 AM Sherry Robbins HERO, MD [facility]  12/20/2023  8:15 AM Sherry Moccasin, MD [facility]  12/27/2023  2:35 PM Sherry Robbins HERO, MD [facility]  01/03/2024  1:15 PM Sherry Suzen Octave, MD [facility]    Robbins HERO Nicholaus, MD

## 2023-12-08 LAB — CERVICOVAGINAL ANCILLARY ONLY
Chlamydia: NEGATIVE
Comment: NEGATIVE
Comment: NORMAL
Neisseria Gonorrhea: NEGATIVE

## 2023-12-09 ENCOUNTER — Ambulatory Visit: Payer: Self-pay | Admitting: Obstetrics and Gynecology

## 2023-12-09 DIAGNOSIS — O99820 Streptococcus B carrier state complicating pregnancy: Secondary | ICD-10-CM

## 2023-12-10 DIAGNOSIS — O99820 Streptococcus B carrier state complicating pregnancy: Secondary | ICD-10-CM | POA: Insufficient documentation

## 2023-12-10 LAB — CULTURE, BETA STREP (GROUP B ONLY): Strep Gp B Culture: POSITIVE — AB

## 2023-12-13 ENCOUNTER — Encounter: Admitting: Obstetrics and Gynecology

## 2023-12-13 ENCOUNTER — Other Ambulatory Visit: Payer: Self-pay

## 2023-12-13 ENCOUNTER — Ambulatory Visit (INDEPENDENT_AMBULATORY_CARE_PROVIDER_SITE_OTHER): Admitting: Obstetrics and Gynecology

## 2023-12-13 ENCOUNTER — Encounter: Payer: Self-pay | Admitting: Obstetrics and Gynecology

## 2023-12-13 VITALS — BP 115/77 | HR 127 | Wt 157.0 lb

## 2023-12-13 DIAGNOSIS — O09293 Supervision of pregnancy with other poor reproductive or obstetric history, third trimester: Secondary | ICD-10-CM

## 2023-12-13 DIAGNOSIS — O99820 Streptococcus B carrier state complicating pregnancy: Secondary | ICD-10-CM | POA: Diagnosis not present

## 2023-12-13 DIAGNOSIS — B20 Human immunodeficiency virus [HIV] disease: Secondary | ICD-10-CM | POA: Diagnosis not present

## 2023-12-13 DIAGNOSIS — Z3A36 36 weeks gestation of pregnancy: Secondary | ICD-10-CM

## 2023-12-13 DIAGNOSIS — O0993 Supervision of high risk pregnancy, unspecified, third trimester: Secondary | ICD-10-CM

## 2023-12-13 DIAGNOSIS — A539 Syphilis, unspecified: Secondary | ICD-10-CM | POA: Diagnosis not present

## 2023-12-13 DIAGNOSIS — O0933 Supervision of pregnancy with insufficient antenatal care, third trimester: Secondary | ICD-10-CM | POA: Diagnosis not present

## 2023-12-13 DIAGNOSIS — O0990 Supervision of high risk pregnancy, unspecified, unspecified trimester: Secondary | ICD-10-CM

## 2023-12-13 DIAGNOSIS — O09299 Supervision of pregnancy with other poor reproductive or obstetric history, unspecified trimester: Secondary | ICD-10-CM

## 2023-12-13 NOTE — Progress Notes (Signed)
 PRENATAL VISIT NOTE  Subjective:  Sherry Robbins is a 28 y.o. [phone] at 108w3d being seen today for ongoing prenatal care.  She is currently monitored for the following issues for this high-risk pregnancy and has Pregnancy and infectious disease in third trimester; Syphilis; HIV disease (HCC); IUGR (intrauterine growth restriction) in prior pregnancy, pregnant; Supervision of high risk pregnancy, antepartum; Late prenatal care affecting pregnancy; Elevated hemoglobin A1c; and Group B streptococcal carriage complicating pregnancy on their problem list.  Patient reports reports normal fetal movement but fewer sharp kicks.  Contractions: Not present. Vag. Bleeding: None.  Movement: Present. Denies leaking of fluid.   The following portions of the patient's history were reviewed and updated as appropriate: allergies, current medications, past family history, past medical history, past social history, past surgical history and problem list.   Objective:   Vitals:   12/13/23 1100  BP: 115/77  Pulse: (!) 127  Weight: 157 lb (71.2 kg)    Fetal Status:  Fetal Heart Rate (bpm): 160   Movement: Present    General: Alert, oriented and cooperative. Patient is in no acute distress.  Skin: Skin is warm and dry. No rash noted.   Cardiovascular: Normal heart rate noted  Respiratory: Normal respiratory effort, no problems with respiration noted  Abdomen: Soft, gravid, appropriate for gestational age.  Pain/Pressure: Absent     Pelvic: Cervical exam deferred        Extremities: Normal range of motion.  Edema: None  Mental Status: Normal mood and affect. Normal behavior. Normal judgment and thought content.      11/17/2023    3:38 PM 03/05/2022   10:42 AM 12/21/2018    4:22 PM  Depression screen PHQ 2/9  Decreased Interest 2 0 0  Down, Depressed, Hopeless 0 1 0  PHQ - 2 Score 2 1 0  Altered sleeping 3    Tired, decreased energy 3    Change in appetite 1    Feeling bad or failure about yourself   0    Trouble concentrating 0    Moving slowly or fidgety/restless 0    Suicidal thoughts 0    PHQ-9 Score 9        Data saved with a previous flowsheet row definition        11/17/2023    3:39 PM  GAD 7 : Generalized Anxiety Score  Nervous, Anxious, on Edge 2  Control/stop worrying 1  Worry too much - different things 2  Trouble relaxing 1  Restless 0  Easily annoyed or irritable 1  Afraid - awful might happen 2  Total GAD 7 Score 9    Assessment and Plan:  Pregnancy: H4E8786 at [redacted]w[redacted]d  1. Supervision of high risk pregnancy, antepartum (Primary)  2. [redacted] weeks gestation of pregnancy  3. IUGR (intrauterine growth restriction) in prior pregnancy, pregnant Last growth 37%tile  4. HIV disease (HCC) 11/26/23 VL undetectable Repeat VL next visit Plan for IOL at 39 weeks  5. Syphilis Last RPR non-reactive 11/17/23  6. Group B streptococcal carriage complicating pregnancy Ppx during labor  7. Late prenatal care affecting pregnancy in third trimester   Preterm labor symptoms and general obstetric precautions including but not limited to vaginal bleeding, contractions, leaking of fluid and fetal movement were reviewed in detail with the patient. Please refer to After Visit Summary for other counseling recommendations.   Return in about 1 week (around 12/20/2023) for high OB.  Future Appointments  Date Time Provider Department Center  12/20/2023  8:15 AM Cleatus Moccasin, MD [facility]  12/27/2023  2:35 PM Nicholaus Burnard HERO, MD [facility]  01/03/2024  1:15 PM Eldonna Suzen Octave, MD [facility]    Burnard HERO Nicholaus, MD

## 2023-12-17 ENCOUNTER — Telehealth (HOSPITAL_COMMUNITY): Payer: Self-pay | Admitting: *Deleted

## 2023-12-17 ENCOUNTER — Encounter (HOSPITAL_COMMUNITY): Payer: Self-pay

## 2023-12-17 NOTE — Telephone Encounter (Signed)
 Preadmission screen

## 2023-12-20 ENCOUNTER — Telehealth (HOSPITAL_COMMUNITY): Payer: Self-pay | Admitting: *Deleted

## 2023-12-20 ENCOUNTER — Encounter: Payer: Self-pay | Admitting: Obstetrics and Gynecology

## 2023-12-20 ENCOUNTER — Encounter (HOSPITAL_COMMUNITY): Payer: Self-pay | Admitting: *Deleted

## 2023-12-20 ENCOUNTER — Other Ambulatory Visit: Payer: Self-pay

## 2023-12-20 ENCOUNTER — Ambulatory Visit: Admitting: Obstetrics and Gynecology

## 2023-12-20 VITALS — BP 113/75 | HR 113 | Wt 160.4 lb

## 2023-12-20 DIAGNOSIS — B20 Human immunodeficiency virus [HIV] disease: Secondary | ICD-10-CM

## 2023-12-20 DIAGNOSIS — O09293 Supervision of pregnancy with other poor reproductive or obstetric history, third trimester: Secondary | ICD-10-CM | POA: Diagnosis not present

## 2023-12-20 DIAGNOSIS — R7309 Other abnormal glucose: Secondary | ICD-10-CM

## 2023-12-20 DIAGNOSIS — O0993 Supervision of high risk pregnancy, unspecified, third trimester: Secondary | ICD-10-CM

## 2023-12-20 DIAGNOSIS — O0990 Supervision of high risk pregnancy, unspecified, unspecified trimester: Secondary | ICD-10-CM

## 2023-12-20 DIAGNOSIS — Z3A37 37 weeks gestation of pregnancy: Secondary | ICD-10-CM | POA: Diagnosis not present

## 2023-12-20 DIAGNOSIS — O99820 Streptococcus B carrier state complicating pregnancy: Secondary | ICD-10-CM

## 2023-12-20 DIAGNOSIS — O09299 Supervision of pregnancy with other poor reproductive or obstetric history, unspecified trimester: Secondary | ICD-10-CM

## 2023-12-20 DIAGNOSIS — O0933 Supervision of pregnancy with insufficient antenatal care, third trimester: Secondary | ICD-10-CM | POA: Diagnosis not present

## 2023-12-20 NOTE — Telephone Encounter (Signed)
 Preadmission screen

## 2023-12-20 NOTE — Progress Notes (Signed)
   PRENATAL VISIT NOTE  Subjective:  Sherry Robbins is a 28 y.o. [phone] at [redacted]w[redacted]d being seen today for ongoing prenatal care.  She is currently monitored for the following issues for this high-risk pregnancy and has Pregnancy and infectious disease in third trimester; Syphilis; HIV disease (HCC); IUGR (intrauterine growth restriction) in prior pregnancy, pregnant; Supervision of high risk pregnancy, antepartum; Late prenatal care affecting pregnancy; Elevated hemoglobin A1c; and Group B streptococcal carriage complicating pregnancy on their problem list.  Patient reports no complaints.  Contractions: Irritability. Vag. Bleeding: None.  Movement: Present. Denies leaking of fluid.   The following portions of the patient's history were reviewed and updated as appropriate: allergies, current medications, past family history, past medical history, past social history, past surgical history and problem list.   Objective:   Vitals:   12/20/23 0827  BP: 113/75  Pulse: (!) 113  Weight: 160 lb 6.4 oz (72.8 kg)    Fetal Status:  Fetal Heart Rate (bpm): 157   Movement: Present    General: Alert, oriented and cooperative. Patient is in no acute distress.  Skin: Skin is warm and dry. No rash noted.   Cardiovascular: Normal heart rate noted  Respiratory: Normal respiratory effort, no problems with respiration noted  Abdomen: Soft, gravid, appropriate for gestational age.  Pain/Pressure: Present (pressure)     Pelvic: Cervical exam deferred        Extremities: Normal range of motion.  Edema: Trace (legs)  Mental Status: Normal mood and affect. Normal behavior. Normal judgment and thought content.   Assessment and Plan:  Pregnancy: H4E8786 at [redacted]w[redacted]d 1. Supervision of high risk pregnancy, antepartum (Primary) GBS pos, PCN in labor. No issues with this in the past.  Vaccines up to date.   2. Pregnancy with 37 weeks completed gestation  3. IUGR (intrauterine growth restriction) in prior pregnancy,  pregnant MFM recommends 39w IOL. She is already scheduled.   4. HIV disease (HCC) On Biktarvy . Viral load not detectable.   5. Group B streptococcal carriage complicating pregnancy PCN in labor.   6. Elevated hemoglobin A1c A1C was 5.8. 1 hr was 144. No follow up testing in context of late prenatal care it seems.  Discussed option for 2 hour prior to delivery vs heel pricks for baby when born. She wold like to try 2 hr before IOL. Recommend 39w delivery as per MFM. See above.   7. Late prenatal care affecting pregnancy in third trimester   Term labor symptoms and general obstetric precautions including but not limited to vaginal bleeding, contractions, leaking of fluid and fetal movement were reviewed in detail with the patient. Please refer to After Visit Summary for other counseling recommendations.   Return in about 1 week (around 12/27/2023).  Future Appointments  Date Time Provider Department Center  12/27/2023  2:35 PM Nicholaus Burnard HERO, MD [facility]  12/31/2023  6:45 AM [facility] SCHED ROOM [facility] None  01/03/2024  1:15 PM Eldonna Suzen Octave, MD [facility]    Vina Solian, MD

## 2023-12-22 ENCOUNTER — Other Ambulatory Visit

## 2023-12-22 ENCOUNTER — Other Ambulatory Visit: Payer: Self-pay

## 2023-12-22 DIAGNOSIS — R7309 Other abnormal glucose: Secondary | ICD-10-CM

## 2023-12-23 ENCOUNTER — Ambulatory Visit: Payer: Self-pay | Admitting: Obstetrics and Gynecology

## 2023-12-23 ENCOUNTER — Encounter: Payer: Self-pay | Admitting: Obstetrics and Gynecology

## 2023-12-23 DIAGNOSIS — O24419 Gestational diabetes mellitus in pregnancy, unspecified control: Secondary | ICD-10-CM | POA: Insufficient documentation

## 2023-12-23 LAB — GLUCOSE TOLERANCE, 2 HOURS W/ 1HR
Glucose, 1 hour: 200 mg/dL — ABNORMAL HIGH (ref 70–179)
Glucose, 2 hour: 170 mg/dL — ABNORMAL HIGH (ref 70–152)
Glucose, Fasting: 96 mg/dL — ABNORMAL HIGH (ref 70–91)

## 2023-12-23 NOTE — Telephone Encounter (Addendum)
-----   Message from Vina Solian sent at 12/23/2023  8:09 AM EST ----- Can you confirm she was fasting? I suspect so. If so, I would let her know she has GDM. She is 38w tomorrow. She is already being induced at 39w. I would just recommend following DM diet. I don't  think there is value in having her do the finger checks but if she watches diet carefully in next week it may help for baby's benefit (keep blood sugar from dropping low after delivery). Please make  sure to go over diabetes type diet. If she would like ref to dietician to know more for diet, we can try that too!  Thanks, pad ----- Message ----- From: Interface, [facility] Lab Results In Sent: 12/23/2023   4:36 AM EST To: Vina Solian, MD  Left message for patient to return call or respond to MyChart message.  MyChart message sent.   Jefferey Lippmann,RN

## 2023-12-25 ENCOUNTER — Other Ambulatory Visit: Payer: Self-pay

## 2023-12-25 ENCOUNTER — Inpatient Hospital Stay (HOSPITAL_COMMUNITY)
Admission: AD | Admit: 2023-12-25 | Discharge: 2023-12-27 | DRG: 806 | Disposition: A | Discharge status: Home / Self Care | Attending: Obstetrics & Gynecology | Admitting: Obstetrics & Gynecology

## 2023-12-25 ENCOUNTER — Encounter (HOSPITAL_COMMUNITY): Payer: Self-pay | Admitting: Obstetrics & Gynecology

## 2023-12-25 DIAGNOSIS — O0990 Supervision of high risk pregnancy, unspecified, unspecified trimester: Principal | ICD-10-CM

## 2023-12-25 DIAGNOSIS — O24429 Gestational diabetes mellitus in childbirth, unspecified control: Secondary | ICD-10-CM | POA: Diagnosis not present

## 2023-12-25 DIAGNOSIS — O26893 Other specified pregnancy related conditions, third trimester: Secondary | ICD-10-CM | POA: Diagnosis not present

## 2023-12-25 DIAGNOSIS — Z21 Asymptomatic human immunodeficiency virus [HIV] infection status: Secondary | ICD-10-CM | POA: Diagnosis not present

## 2023-12-25 DIAGNOSIS — O99820 Streptococcus B carrier state complicating pregnancy: Secondary | ICD-10-CM | POA: Diagnosis not present

## 2023-12-25 DIAGNOSIS — Z87891 Personal history of nicotine dependence: Secondary | ICD-10-CM | POA: Diagnosis not present

## 2023-12-25 DIAGNOSIS — O99824 Streptococcus B carrier state complicating childbirth: Secondary | ICD-10-CM | POA: Diagnosis not present

## 2023-12-25 DIAGNOSIS — O9872 Human immunodeficiency virus [HIV] disease complicating childbirth: Secondary | ICD-10-CM | POA: Diagnosis not present

## 2023-12-25 DIAGNOSIS — O24420 Gestational diabetes mellitus in childbirth, diet controlled: Secondary | ICD-10-CM | POA: Diagnosis not present

## 2023-12-25 DIAGNOSIS — Z3A38 38 weeks gestation of pregnancy: Secondary | ICD-10-CM | POA: Diagnosis not present

## 2023-12-25 DIAGNOSIS — O0933 Supervision of pregnancy with insufficient antenatal care, third trimester: Secondary | ICD-10-CM | POA: Diagnosis not present

## 2023-12-25 LAB — POCT FERN TEST: POCT Fern Test: POSITIVE

## 2023-12-25 LAB — CBC
HCT: 30.6 % — ABNORMAL LOW (ref 36.0–46.0)
Hemoglobin: 9 g/dL — ABNORMAL LOW (ref 12.0–15.0)
MCH: 20.8 pg — ABNORMAL LOW (ref 26.0–34.0)
MCHC: 29.4 g/dL — ABNORMAL LOW (ref 30.0–36.0)
MCV: 70.8 fL — ABNORMAL LOW (ref 80.0–100.0)
Platelets: 322 K/uL (ref 150–400)
RBC: 4.32 MIL/uL (ref 3.87–5.11)
RDW: 19.7 % — ABNORMAL HIGH (ref 11.5–15.5)
WBC: 12 K/uL — ABNORMAL HIGH (ref 4.0–10.5)
nRBC: 0 % (ref 0.0–0.2)

## 2023-12-25 MED ORDER — SOD CITRATE-CITRIC ACID 500-334 MG/5ML PO SOLN: 30.0000 mL | ORAL | Status: DC | PRN

## 2023-12-25 MED ORDER — SENNOSIDES-DOCUSATE SODIUM 8.6-50 MG PO TABS
2.0000 | ORAL_TABLET | Freq: Every day | ORAL | Status: DC
  Administered 2023-12-26 – 2023-12-27 (×2): 2 via ORAL
  Filled 2023-12-25 (×2): qty 2

## 2023-12-25 MED ORDER — COCONUT OIL OIL: 1.0000 | TOPICAL_OIL | Status: DC | PRN

## 2023-12-25 MED ORDER — BENZOCAINE-MENTHOL 20-0.5 % EX AERO: 1.0000 | INHALATION_SPRAY | CUTANEOUS | Status: DC | PRN

## 2023-12-25 MED ORDER — PHENYLEPHRINE 80 MCG/ML (10ML) SYRINGE FOR IV PUSH (FOR BLOOD PRESSURE SUPPORT): 80.0000 ug | PREFILLED_SYRINGE | INTRAVENOUS | Status: DC | PRN

## 2023-12-25 MED ORDER — EPHEDRINE 5 MG/ML INJ
10.0000 mg | INTRAVENOUS | Status: DC | PRN
Start: 2023-12-25 — End: 2023-12-25

## 2023-12-25 MED ORDER — ZOLPIDEM TARTRATE 5 MG PO TABS: 5.0000 mg | ORAL_TABLET | Freq: Every evening | ORAL | Status: DC | PRN

## 2023-12-25 MED ORDER — SIMETHICONE 80 MG PO CHEW: 80.0000 mg | CHEWABLE_TABLET | ORAL | Status: DC | PRN

## 2023-12-25 MED ORDER — LACTATED RINGERS IV SOLN: INTRAVENOUS | Status: DC

## 2023-12-25 MED ORDER — SODIUM CHLORIDE 0.9 % IV SOLN: 5.0000 10*6.[IU] | Freq: Once | INTRAVENOUS | Status: DC

## 2023-12-25 MED ORDER — SODIUM CHLORIDE 0.9 % IV SOLN: 1.0000 g | INTRAVENOUS | Status: DC

## 2023-12-25 MED ORDER — OXYTOCIN BOLUS FROM INFUSION
333.0000 mL | Freq: Once | INTRAVENOUS | Status: AC
  Administered 2023-12-25: 333 mL via INTRAVENOUS

## 2023-12-25 MED ORDER — ONDANSETRON HCL 4 MG/2ML IJ SOLN: 4.0000 mg | Freq: Four times a day (QID) | INTRAMUSCULAR | Status: DC | PRN

## 2023-12-25 MED ORDER — BICTEGRAVIR-EMTRICITAB-TENOFOV 50-200-25 MG PO TABS
1.0000 | ORAL_TABLET | Freq: Every day | ORAL | Status: DC
  Administered 2023-12-25 – 2023-12-26 (×2): 1 via ORAL
  Filled 2023-12-25 (×4): qty 1

## 2023-12-25 MED ORDER — LACTATED RINGERS IV SOLN: 500.0000 mL | INTRAVENOUS | Status: DC | PRN

## 2023-12-25 MED ORDER — DIPHENHYDRAMINE HCL 50 MG/ML IJ SOLN: 12.5000 mg | INTRAMUSCULAR | Status: DC | PRN

## 2023-12-25 MED ORDER — ACETAMINOPHEN 325 MG PO TABS: 650.0000 mg | ORAL_TABLET | ORAL | Status: DC | PRN

## 2023-12-25 MED ORDER — ONDANSETRON HCL 4 MG/2ML IJ SOLN: 4.0000 mg | INTRAMUSCULAR | Status: DC | PRN

## 2023-12-25 MED ORDER — FENTANYL CITRATE (PF) 100 MCG/2ML IJ SOLN
50.0000 ug | INTRAMUSCULAR | Status: DC | PRN
  Administered 2023-12-25: 100 ug via INTRAVENOUS
  Filled 2023-12-25: qty 2

## 2023-12-25 MED ORDER — SODIUM CHLORIDE 0.9 % IV SOLN: 2.0000 g | Freq: Four times a day (QID) | INTRAVENOUS | Status: DC

## 2023-12-25 MED ORDER — OXYTOCIN-SODIUM CHLORIDE 30-0.9 UT/500ML-% IV SOLN
2.5000 [IU]/h | INTRAVENOUS | Status: DC
  Filled 2023-12-25: qty 500

## 2023-12-25 MED ORDER — OXYCODONE-ACETAMINOPHEN 5-325 MG PO TABS: 2.0000 | ORAL_TABLET | ORAL | Status: DC | PRN

## 2023-12-25 MED ORDER — LIDOCAINE HCL (PF) 1 % IJ SOLN: 30.0000 mL | INTRAMUSCULAR | Status: DC | PRN

## 2023-12-25 MED ORDER — SODIUM CHLORIDE 0.9 % IV SOLN: 2.0000 g | Freq: Once | INTRAVENOUS | Status: DC

## 2023-12-25 MED ORDER — WITCH HAZEL-GLYCERIN EX PADS: 1.0000 | MEDICATED_PAD | CUTANEOUS | Status: DC | PRN

## 2023-12-25 MED ORDER — DIBUCAINE (PERIANAL) 1 % EX OINT: 1.0000 | TOPICAL_OINTMENT | CUTANEOUS | Status: DC | PRN

## 2023-12-25 MED ORDER — LACTATED RINGERS IV SOLN: 500.0000 mL | Freq: Once | INTRAVENOUS | Status: DC

## 2023-12-25 MED ORDER — PENICILLIN G POT IN DEXTROSE 60000 UNIT/ML IV SOLN: 3.0000 10*6.[IU] | INTRAVENOUS | Status: DC

## 2023-12-25 MED ORDER — PRENATAL MULTIVITAMIN CH
1.0000 | ORAL_TABLET | Freq: Every day | ORAL | Status: DC
  Administered 2023-12-26 – 2023-12-27 (×2): 1 via ORAL
  Filled 2023-12-25 (×3): qty 1

## 2023-12-25 MED ORDER — DIPHENHYDRAMINE HCL 25 MG PO CAPS: 25.0000 mg | ORAL_CAPSULE | Freq: Four times a day (QID) | ORAL | Status: DC | PRN

## 2023-12-25 MED ORDER — OXYCODONE-ACETAMINOPHEN 5-325 MG PO TABS: 1.0000 | ORAL_TABLET | ORAL | Status: DC | PRN

## 2023-12-25 MED ORDER — ONDANSETRON HCL 4 MG PO TABS: 4.0000 mg | ORAL_TABLET | ORAL | Status: DC | PRN

## 2023-12-25 MED ORDER — EPHEDRINE 5 MG/ML INJ: 10.0000 mg | INTRAVENOUS | Status: DC | PRN

## 2023-12-25 MED ORDER — FENTANYL-BUPIVACAINE-NACL 0.5-0.125-0.9 MG/250ML-% EP SOLN: 12.0000 mL/h | EPIDURAL | Status: DC | PRN

## 2023-12-25 MED ORDER — IBUPROFEN 600 MG PO TABS
600.0000 mg | ORAL_TABLET | Freq: Four times a day (QID) | ORAL | Status: DC
  Administered 2023-12-25 – 2023-12-27 (×7): 600 mg via ORAL
  Filled 2023-12-25 (×7): qty 1

## 2023-12-25 NOTE — MAU Note (Signed)
 Sherry Robbins is a 28 y.o. at [redacted]w[redacted]d here in MAU reporting: possible ROM at 1500, states she noticed it while she was taking a shower and continued to leak out after she got out of the shower. Clear in color, has a pad on. Reports CTXs are 5-10 mins apart. Denies any VB. Reports +FM   Onset of complaint: 1500 Pain score: 7, possible epidural  Vitals:   12/25/23 1544  BP: 120/69  Pulse: (!) 115  Resp: 18  Temp: 98.1 F (36.7 C)  SpO2: 99%     QYU:izqzmmzi due to patient clothing  Lab orders placed from triage:  fern, labor eval

## 2023-12-25 NOTE — H&P (Addendum)
 OBSTETRIC ADMISSION HISTORY AND PHYSICAL  Sherry Robbins is a 28 y.o. female [phone] with IUP at [redacted]w[redacted]d by LMP presenting for SIL. She reports +FMs, No LOF, no VB, no blurry vision, headaches or peripheral edema, and RUQ pain.  She plans on formula feeding. She requests depo for birth control. She received her prenatal care at [facility]   Dating: By LMP --->  Estimated Date of Delivery: 01/07/24  Sono:    @[redacted]w[redacted]d , CWD, normal anatomy, cephalic presentation, 2486g, 62% EFW  Prenatal History/Complications:  HIV on Biktarvy  (viral load<20 11/26/23)  Syphilis s/p treatment (negative RPR this pregnancy) GBS +  Late prenatal care GDM, diagnosed late in pregnancy  Past Medical History: Past Medical History:  Diagnosis Date   Adverse drug reaction 03/23/2018   Anemia 05/05/2014   HIV disease (HCC) 06/2014   Tachycardia 10/2014    Past Surgical History: Past Surgical History:  Procedure Laterality Date   NO PAST SURGERIES      Obstetrical History: OB History     Gravida  5   Para  3   Term  1   Preterm  2   AB  1   Living  3      SAB  0   IAB  1   Ectopic  0   Multiple  0   Live Births  3           Social History Social History   Socioeconomic History   Marital status: Single    Spouse name: Not on file   Number of children: Not on file   Years of education: Not on file   Highest education level: Not on file  Occupational History   Not on file  Tobacco Use   Smoking status: Former    Types: Cigars    Quit date: 08/30/2014    Years since quitting: 9.3   Smokeless tobacco: Never  Vaping Use   Vaping status: Never Used  Substance and Sexual Activity   Alcohol use: Not Currently   Drug use: Not Currently    Types: Marijuana    Comment: last used April 2025   Sexual activity: Yes    Partners: Male  Other Topics Concern   Not on file  Social History Narrative   Not on file   Social Drivers of Health   Financial Resource Strain: Low Risk   (07/15/2023)   Received from [facility]   Overall Financial Resource Strain (CARDIA)    Difficulty of Paying Living Expenses: Not very hard  Food Insecurity: No Food Insecurity (12/25/2023)   Hunger Vital Sign    Worried About Running Out of Food in the Last Year: Never true    Ran Out of Food in the Last Year: Never true  Transportation Needs: No Transportation Needs (12/25/2023)   PRAPARE - Administrator, Civil Service (Medical): No    Lack of Transportation (Non-Medical): No  Physical Activity: Insufficiently Active (07/15/2023)   Received from [facility]   Exercise Vital Sign    On average, how many days per week do you engage in moderate to strenuous exercise (like a brisk walk)?: 2 days    On average, how many minutes do you engage in exercise at this level?: 30 min  Stress: No Stress Concern Present (07/15/2023)   Received from [facility] - Occupational Stress Questionnaire    Feeling of Stress : Only a little  Social Connections: Somewhat Isolated (  07/15/2023)   Received from [facility]   Social Network    How would you rate your social network (family, work, friends)?: Restricted participation with some degree of social isolation    Family History: Family History  Problem Relation Age of Onset   Allergic rhinitis Mother    Food Allergy Mother    Cancer Maternal Grandmother    Pancreatic cancer Maternal Grandfather     Allergies: Allergies  Allergen Reactions   Ceftriaxone  Other (See Comments)    Reaction:  Fever and cytopenia   Cephalexin  Other (See Comments)    Reaction:  Fever and cytopenia   Zithromax  [Azithromycin ] Other (See Comments)    Reaction:  Fever and cytopenia    Medications Prior to Admission  Medication Sig Dispense Refill Last Dose/Taking   bictegravir-emtricitabine -tenofovir  AF (BIKTARVY ) 50-200-25 MG TABS tablet Take 1 tablet by mouth daily. 30 tablet 10 12/24/2023   ferrous  sulfate 325 (65 FE) MG EC tablet Take 1 tablet (325 mg total) by mouth every other day. 30 tablet 2 Past Week   omeprazole  (PRILOSEC) 40 MG capsule Take 1 capsule (40 mg total) by mouth daily. 30 capsule 3 12/24/2023   Prenatal Vit-Fe Fumarate-FA (MULTIVITAMIN-PRENATAL) 27-0.8 MG TABS tablet Take 1 tablet by mouth daily at 12 noon.   Past Week   nitrofurantoin , macrocrystal-monohydrate, (MACROBID ) 100 MG capsule Take 1 capsule (100 mg total) by mouth 2 (two) times daily. (Patient not taking: Reported on 12/20/2023) 14 capsule 0      Review of Systems   All systems reviewed and negative except as stated in HPI  Blood pressure 115/68, pulse 83, temperature 98.1 F (36.7 C), temperature source Oral, resp. rate 17, height 5' 4 (1.626 m), weight 73.8 kg, last menstrual period 04/02/2023, SpO2 99%, unknown if currently breastfeeding. General appearance: alert and cooperative Lungs: clear to auscultation bilaterally Heart: regular rate and rhythm Abdomen: soft, non-tender; bowel sounds normal Extremities: Homans sign is negative, no sign of DVT Presentation: cephalic Fetal monitoring 170 baseline, moderate variability Uterine activity present Dilation: 10 Effacement (%): 90 Station: Plus 1 Exam by:: Arvin Slicker RN   Prenatal labs: ABO, Rh: --/--/PENDING (11/22 1645) Antibody: PENDING (11/22 1645) Rubella: 7.03 (10/15 1535) RPR: Non Reactive (10/15 1535)  HBsAg: Negative (10/15 1535)  HIV: Preliminary Reactive (10/15 1535)  GBS: Positive/-- (11/04 1056)    Lab Results  Component Value Date   GBS Positive (A) 12/07/2023   GTT- failed 12/22/23 (200/96/170); not on any meds Genetic screening NIPS LR female Anatomy US  nl  Immunization History  Administered Date(s) Administered    sv, Bivalent, Protein Subunit Rsvpref,pf (Abrysvo) 12/07/2023   DTaP 05/26/1995, 07/22/1995, 10/01/1995, 07/05/1996, 07/05/2000   HIB (PRP-T) 05/26/1995, 07/22/1995, 10/01/1995, 07/04/1996   Hepatitis  A 07/16/2014   Hepatitis A, Ped/Adol-2 Dose 07/16/2014   Hepatitis B Aug 13, 1995, 04/19/1995, 12/14/1995   Hepatitis B, PED/ADOLESCENT 03-24-1995, 04/19/1995, 12/14/1995   IPV 05/26/1995, 07/22/1995, 07/04/1996, 07/05/2000   Influenza, Seasonal, Injecte, Preservative Fre 11/14/2014, 12/11/2015, 11/17/2023   Influenza,inj,Quad PF,6+ Mos 12/11/2015, 12/21/2018   Influenza-Unspecified 11/14/2014, 12/11/2015   MMR 07/04/1996, 07/05/2000   Meningococcal Conjugate 09/10/2017   Meningococcal Mcv4o 09/10/2017   Pneumococcal Conjugate-13 07/16/2014   Pneumococcal Polysaccharide-23 01/06/2016   Tdap 01/06/2016, 04/03/2019, 11/17/2023   Varicella 07/05/2000    Prenatal Transfer Tool  Maternal Diabetes: Yes:  Diabetes Type:  Diet controlled Genetic Screening: Normal Maternal Ultrasounds/Referrals: Normal Fetal Ultrasounds or other Referrals:  None Maternal Substance Abuse:  No Significant Maternal Medications:  Meds include: Other: Biktarvy   Significant Maternal Lab Results: Group B Strep positive and HIV positive Number of Prenatal Visits:greater than 3 verified prenatal visits Maternal Vaccinations:TDap and Flu Other Comments:  None   Results for orders placed or performed during the hospital encounter of 12/25/23 (from the past 24 hours)  POCT fern test   Collection Time: 12/25/23  4:01 PM  Result Value Ref Range   POCT Fern Test Positive = ruptured amniotic membanes   CBC   Collection Time: 12/25/23  4:45 PM  Result Value Ref Range   WBC 12.0 (H) 4.0 - 10.5 K/uL   RBC 4.32 3.87 - 5.11 MIL/uL   Hemoglobin 9.0 (L) 12.0 - 15.0 g/dL   HCT 69.3 (L) 63.9 - 53.9 %   MCV 70.8 (L) 80.0 - 100.0 fL   MCH 20.8 (L) 26.0 - 34.0 pg   MCHC 29.4 (L) 30.0 - 36.0 g/dL   RDW 80.2 (H) 88.4 - 84.4 %   Platelets 322 150 - 400 K/uL   nRBC 0.0 0.0 - 0.2 %  Type and screen [facility]   Collection Time: 12/25/23  4:45 PM  Result Value Ref Range   ABO/RH(D) PENDING    Antibody Screen  PENDING    Sample Expiration      12/28/2023,2359 Performed at [facility], [address] [place], [place] [address]     Patient Active Problem List   Diagnosis Date Noted   Normal labor 12/25/2023   Gestational diabetes 12/23/2023   Group B streptococcal carriage complicating pregnancy 12/10/2023   Late prenatal care affecting pregnancy 11/24/2023   Elevated hemoglobin A1c 11/24/2023   Supervision of high risk pregnancy, antepartum 11/17/2023   IUGR (intrauterine growth restriction) in prior pregnancy, pregnant 06/21/2023   HIV disease (HCC) 06/14/2023   Syphilis 03/05/2022   Pregnancy and infectious disease in third trimester 12/21/2018    Assessment/Plan:  ANNALEA ALGUIRE is a 28 y.o. H4E8786 at [redacted]w[redacted]d here for SIL  #HIV Positive Negative viral load. On Biktarvy . Peds team aware.  #Labor: expectant management #Pain: Per pt preference #FWB: Reactive NST #GBS status:  Positive - abx ordered #Feeding: Formula #Reproductive Life planning: Depo Provera  #Circ:  yes  Paralee JONELLE Carpen, MD  12/25/2023, 5:36 PM

## 2023-12-25 NOTE — Discharge Summary (Shared)
 Postpartum Discharge Summary  Date of Service updated***     Patient Name: Sherry Robbins DOB: 10-Jun-1995 MRN: [id number]  Date of admission: 12/25/2023 Delivery date:12/25/2023 Delivering provider: EVELINE LYNWOOD MATSU Date of discharge: 12/25/2023  Admitting diagnosis: Normal labor [O80, Z37.9] Intrauterine pregnancy: [redacted]w[redacted]d     Secondary diagnosis:  Principal Problem:   Normal labor  Additional problems: HIV positive, h/o syphilis    Discharge diagnosis: {DX.:23714}                                              Post partum procedures:{Postpartum procedures:23558} Augmentation: {Augmentation:20782} Complications: {OB Labor/Delivery Complications:20784}  Hospital course: {Courses:23701}  Magnesium  Sulfate received: {Mag received:30440022} BMZ received: {BMZ received:30440023} Rhophylac:{Rhophylac received:30440032} FFM:{FFM:69559966} T-DaP:{Tdap:23962} Flu: {Qol:76036} RSV Vaccine received: {RSV:31013} Transfusion:{Transfusion received:30440034}  Immunizations received: Immunization History  Administered Date(s) Administered    sv, Bivalent, Protein Subunit Rsvpref,pf (Abrysvo) 12/07/2023   DTaP 05/26/1995, 07/22/1995, 10/01/1995, 07/05/1996, 07/05/2000   HIB (PRP-T) 05/26/1995, 07/22/1995, 10/01/1995, 07/04/1996   Hepatitis A 07/16/2014   Hepatitis A, Ped/Adol-2 Dose 07/16/2014   Hepatitis B 31-Jan-1996, 04/19/1995, 12/14/1995   Hepatitis B, PED/ADOLESCENT 1995-05[birth year]01, 04/19/1995, 12/14/1995   IPV 05/26/1995, 07/22/1995, 07/04/1996, 07/05/2000   Influenza, Seasonal, Injecte, Preservative Fre 11/14/2014, 12/11/2015, 11/17/2023   Influenza,inj,Quad PF,6+ Mos 12/11/2015, 12/21/2018   Influenza-Unspecified 11/14/2014, 12/11/2015   MMR 07/04/1996, 07/05/2000   Meningococcal Conjugate 09/10/2017   Meningococcal Mcv4o 09/10/2017   Pneumococcal Conjugate-13 07/16/2014   Pneumococcal Polysaccharide-23 01/06/2016   Tdap 01/06/2016, 04/03/2019, 11/17/2023   Varicella  07/05/2000    Physical exam  Vitals:   12/25/23 1544  BP: 120/69  Pulse: (!) 115  Resp: 18  Temp: 98.1 F (36.7 C)  TempSrc: Oral  SpO2: 99%  Weight: 73.8 kg  Height: 5' 4 (1.626 m)   General: {Exam; general:21111117} Lochia: {Desc; appropriate/inappropriate:30686::appropriate} Uterine Fundus: {Desc; firm/soft:30687} Incision: {Exam; incision:21111123} DVT Evaluation: {Exam; dvt:2111122} Labs: Lab Results  Component Value Date   WBC 7.0 11/17/2023   HGB 9.8 (L) 11/17/2023   HCT 31.4 (L) 11/17/2023   MCV 74 (L) 11/17/2023   PLT 254 11/17/2023      Latest Ref Rng & Units 06/14/2023   12:09 PM  CMP  Glucose 65 - 99 mg/dL 78   BUN 7 - 25 mg/dL 6   Creatinine 9.49 - 9.03 mg/dL 9.32   Sodium 864 - 853 mmol/L 138   Potassium 3.5 - 5.3 mmol/L 3.8   Chloride 98 - 110 mmol/L 101   CO2 20 - 32 mmol/L 26   Calcium 8.6 - 10.2 mg/dL 9.7   Total Protein 6.1 - 8.1 g/dL 7.6   Total Bilirubin 0.2 - 1.2 mg/dL 0.4   AST 10 - 30 U/L 12   ALT 6 - 29 U/L 10    Edinburgh Score:     No data to display         No data recorded  After visit meds:  Allergies as of 12/25/2023       Reactions   Ceftriaxone  Other (See Comments)   Reaction:  Fever and cytopenia   Cephalexin  Other (See Comments)   Reaction:  Fever and cytopenia   Zithromax  [azithromycin ] Other (See Comments)   Reaction:  Fever and cytopenia     Med Rec must be completed prior to using this [facility]***        Discharge home in stable  condition Infant Feeding: {Baby feeding:23562} Infant Disposition:{CHL IP OB HOME WITH FNUYZM:76418} Discharge instruction: per After Visit Summary and Postpartum booklet. Activity: Advance as tolerated. Pelvic rest for 6 weeks.  Diet: {OB ipzu:78888878} Future Appointments: Future Appointments  Date Time Provider Department Center  12/27/2023  2:35 PM Nicholaus Burnard HERO, MD [facility]   Follow up Visit:   Please schedule this patient for a {Visit type:23955}  postpartum visit in {Postpartum visit:23953} with the following provider: {Provider type:23954}. Additional Postpartum F/U:{PP Procedure:23957}  {Risk level:23960} pregnancy complicated by: {complication:23959} Delivery mode:  Vaginal, Spontaneous Anticipated Birth Control:  {Birth Control:23956}   12/25/2023 Lynwood Solomons, MD

## 2023-12-25 NOTE — Lactation Note (Signed)
 This note was copied from a baby's chart. Lactation Consultation Note  Patient Name: Sherry Robbins Unijb'd Date: 12/25/2023 Age:28 hours Reason for consult: Initial assessment;Mother's request;1st time breastfeeding;Early term 37-38.6wks;MD order;Maternal endocrine disorder (HIV+)  P4- MOB came into the hospital wanting to breastfeed and formula feed. MOB is HIV+, but her viral load is undetectable. MOB did not breastfeed any of her older three children, but reports that her milk did come in with them. MOB had a female and female visitor in the room with her. LC informed MOB that LC had to discuss sensitive medical information and asked if either visitor needed to leave. MOB declined and encouraged LC to discuss these matters in front of her two visitors.   LC reviewed breastfeeding vs formula with HIV to assist MOB with making an informed decision. MOB understands that formula feeding eliminates the risk of postnatal HIV transmission to infant. LC reviewed how maintaining viral load suppression through ART decreases transmission risk to less than 1%, but not zero. MOB reports not understanding why she should exclusively breastfeed if breastfeeding is the choice she prefers. LC reviewed how combo feeding can increase the risk of HIV transmission to infant in simple wording. LC reviewed how if breastfeeding and MOB develops mastitis, bleeding nipples, detectable viral load or is not adherent to medication, MOB would need to stop breastfeeding. MOB verbalized understanding of all education. MOB is unsure of how she wants to feed infant after education. LC encouraged MOB to think of her options and call out to let [facility] know what her decision is. MOB verbalized agreement.  Maternal Data Has patient been taught Hand Expression?: No Does the patient have breastfeeding experience prior to this delivery?: No  Feeding Mother's Current Feeding Choice:  (MOB is unsure)  Lactation Tools Discussed/Used Pump  Education: Milk Storage  Interventions Interventions: Breast feeding basics reviewed;Education;LC Services brochure  Discharge Discharge Education: Engorgement and breast care;Warning signs for feeding baby Pump: Declined  Consult Status Consult Status: Follow-up Date: 12/26/23 Follow-up type: In-patient    Recardo Hoit BS, IBCLC 12/25/2023, 10:17 PM

## 2023-12-26 LAB — HIV-1 RNA QUANT-NO REFLEX-BLD
HIV 1 RNA Quant: <20 {copies}/mL
LOG10 HIV-1 RNA: UNDETERMINED {Log_copies}/mL

## 2023-12-26 LAB — SYPHILIS: RPR W/REFLEX TO RPR TITER AND TREPONEMAL ANTIBODIES, TRADITIONAL SCREENING AND DIAGNOSIS ALGORITHM
RPR Ser Ql: REACTIVE — AB
RPR Titer: 1:1 {titer}

## 2023-12-26 NOTE — Progress Notes (Signed)
 Post Partum Day 1 Subjective:  Sherry Robbins is a 28 y.o. H4E7785 [redacted]w[redacted]d s/p SVD.  No acute events overnight.  Pt denies problems with ambulating, voiding or po intake.  She denies nausea or vomiting.  Pain is well controlled.  She has had flatus.  Lochia Small.  Plan for birth control is Depo-Provera .  Method of Feeding: Formula, was counseled on relative safety of breastfeeding  Objective: Blood pressure 105/60, pulse 80, temperature 98.4 F (36.9 C), resp. rate 18, height 5' 4 (1.626 m), weight 73.8 kg, last menstrual period 04/02/2023, SpO2 100%, unknown if currently breastfeeding.  Physical Exam:  General: alert, cooperative and no distress Lochia:normal flow Chest: normal WOB Heart: Regular rate Abdomen: +BS, soft, mild TTP (appropriate) Uterine Fundus: firm, below umbilicus DVT Evaluation: No evidence of DVT seen on physical exam. Extremities: No edema  Recent Labs    12/25/23 1645  HGB 9.0*  HCT 30.6*    Assessment/Plan:  ASSESSMENT: Sherry Robbins is a 28 y.o. H4E7785 [redacted]w[redacted]d s/p SVD  Plan for discharge tomorrow and Circumcision prior to discharge Continue routine PP care Breastfeeding support PRN  LOS: 1 day   Sherry Robbins 12/26/2023, 9:14 AM

## 2023-12-26 NOTE — Lactation Note (Signed)
 This note was copied from a baby's chart. Lactation Consultation Note  Patient Name: Sherry Robbins Unijb'd Date: 12/26/2023 Age:28 hours  Per Elijah Kerns RN, mother is currently formula feeding.   Feeding Mother's Current Feeding Choice: Formula   [name]  RN, IBCLC 12/26/2023, 12:37 PM

## 2023-12-26 NOTE — Clinical Social Work Maternal (Addendum)
 CLINICAL SOCIAL WORK MATERNAL/CHILD NOTE  Patient Details  Name: Sherry Robbins MRN: [id number] Date of Birth: 11/14/1995  Date:  10-04-23  Clinical Social Worker Initiating Note:  Sharyne Roulette, LCSWA Date/Time: Initiated:  12/26/23/1817     Child's Name:  Sherry Robbins   Biological Parents:  Father, Mother (FOB: Sherry Robbins, DOB: 01/18/1981)   Need for Interpreter:  None   Reason for Referral:  Behavioral Health Concerns, Other (Comment) (Pediatric Infectious Disease Referral)   Address:  [address] 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    HM/SP -2 Sherry Robbins Younger brother's father    HM/SP -3 Sherry Robbins Brother 16  HM/SP -4        HM/SP -5        HM/SP -6        HM/SP -7        HM/SP -8          Natural Supports (not living in the home):  Spouse/significant other   Professional Supports: None   Employment: Unemployed   Type of Work:     Education:  Other (comment) (8th grade)   Homebound arranged:    Surveyor, Quantity Resources:  Medicaid   Other Resources:  WIC (WIC referral placed during consult)   Cultural/Religious Considerations Which May Impact Care:  Per [employer], she identifies as Financial Controller:  Ability to meet basic needs  , Merchandiser, retail, Home prepared for child     Psychotropic Medications:         Pediatrician:    [employer] area  Pediatrician List:   [facility] [employer]    [place] [facility]      Pediatrician Fax Number:    Risk Factors/Current Problems:  Mental Health Concerns     Cognitive State:  Alert  , Able to Concentrate  , Linear Thinking  ,  Goal Oriented     Mood/Affect:  Interested  , Relaxed  , Calm  , Comfortable     CSW Assessment: 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 CSW met with MOB at bedside to complete assessment. When CSW entered room, MOB was observed sitting in hospital bed. FOB and maternal grandmother [name]) were present nearby. CSW introduced self and requested to speak with MOB alone. FOB and MGM left the room. CSW explained reason for consult. MOB presented as calm, was agreeable to consult and remained engaged throughout encounter.   CSW reviewed demographic information listed in chart. MOB reports she has 4 children (including infant). MOB reports her 3 other children Sherry Robbins (DOB: 06/12/2019), Sherry Robbins (DOB: 01/04/2016), and Sherry Robbins (DOB: 03/27/2015)) live with their father, [name] in [place] [address] and states that CPS was not involved in their living arrangement. MOB identified MGM, MGM's friend, and FOB as her primary supports.  CSW explained that due to MOB's +HIV status, she will need to take infant for follow up at a pediatric infectious diseases clinic at 25-63 weeks of age. CSW reviewed clinic options, MOB chose to follow up at [facility]. CSW to fax referral. CSW explained that due to it being a weekend, the social worker from [facility] will contact her during the week to notify her of infant's appointment date and time. MOB expressed understanding. CSW provided MOB with clinic contact information and driving instructions. MOB denied transportation issues and denied having additional questions.   CSW assessed for mood and mental health history. MOB reports feeling happy since infant's arrival. MOB acknowledged a history of anxiety, reporting she was initially diagnosed around 2018. MOB shared that she has not been clinically diagnosed with  depression but took a self assessment in 2022 and screened positive for depressive symptoms. CSW inquired about MOB's mood during pregnancy. MOB reports endorsing worry about infant's health during her pregnancy but did not feel her symptoms were more than what is typical for her. MOB reports endorsing symptoms of postpartum depression following the birth of her first and third children (first and most recent delivery). MOB recalls feeling down and having decreased motivation after the birth of her first son and recalls feeling stressed financially at the time. Following the birth of her 3rd son, MOB recalls feeling down, which she attributed to having limited support. MOB states she did not receive treatment for postpartum depression symptoms and she felt her symptoms improved over time. MOB states she is not currently receiving mental health treatment but expressed interest in meeting with an integrated behavioral health therapist and requested additional mental health resources, which CSW provided. Patient requests a referral to Integrated Behavioral Health (IBH). Patient verbalizes understanding that the appointment will be virtual.CSW placed IBH referral. CSW assessed for safety. MOB denied current SI/HI/domestic violence.   CSW provided education regarding the baby blues period vs. perinatal mood disorders, discussed treatment and gave resources for mental health follow up if concerns arise.  CSW recommends self-evaluation during the postpartum time period using the New Mom Checklist from Postpartum Progress and encouraged MOB to contact a medical professional if symptoms are noted at any time.    MOB reports she has all needed items for infant, including a car seat and bassinet. MOB stated that she has some diapers and wipes but will need more. CSW provided information about [facility] for diapers and wipes. MOB reports she does not receive WIC or food stamps but is considering restarting WIC  benefits and applying for food stamps. CSW placed [facility] referral with MOB's consent and MOB states she is familiar with the online food stamps application process. MOB has chosen [facility] for infant's follow up care. MOB declined additional resource needs.   CSW provided review of Sudden Infant Death Syndrome (SIDS) precautions.    CSW identifies no further need for intervention and no barriers to discharge at this time.  CSW Plan/Description:  No Further Intervention Required/No Barriers to Discharge, Sudden Infant Death Syndrome (SIDS) Education, Perinatal Mood and Anxiety Disorder (PMADs) Education, Other Information/Referral to [employer] K Belle[place]onte, LCSWA 08/04/23, 6:23 PM

## 2023-12-27 ENCOUNTER — Telehealth: Payer: Self-pay | Admitting: Clinical

## 2023-12-27 ENCOUNTER — Encounter: Admitting: Obstetrics and Gynecology

## 2023-12-27 ENCOUNTER — Telehealth (HOSPITAL_COMMUNITY): Payer: Self-pay | Admitting: *Deleted

## 2023-12-27 ENCOUNTER — Other Ambulatory Visit (HOSPITAL_COMMUNITY): Payer: Self-pay

## 2023-12-27 DIAGNOSIS — Z1331 Encounter for screening for depression: Secondary | ICD-10-CM

## 2023-12-27 LAB — T.PALLIDUM AB, TOTAL: T Pallidum Abs: REACTIVE — AB

## 2023-12-27 MED ORDER — ACETAMINOPHEN 500 MG PO TABS
1000.0000 mg | ORAL_TABLET | Freq: Four times a day (QID) | ORAL | 0 refills | Status: AC | PRN
  Filled 2023-12-27: qty 30, 4d supply, fill #0

## 2023-12-27 MED ORDER — SENNOSIDES-DOCUSATE SODIUM 8.6-50 MG PO TABS
2.0000 | ORAL_TABLET | Freq: Every evening | ORAL | 0 refills | Status: AC | PRN
  Filled 2023-12-27: qty 30, 15d supply, fill #0

## 2023-12-27 MED ORDER — IBUPROFEN 600 MG PO TABS
600.0000 mg | ORAL_TABLET | Freq: Four times a day (QID) | ORAL | 0 refills | Status: AC | PRN
  Filled 2023-12-27: qty 30, 8d supply, fill #0

## 2023-12-27 NOTE — Social Work (Signed)
 CSW arranged an appointment with [facility] Pediatric Infectious Disease for the infant on  01/07/2024 @ 3pm with Dr. Zoanne.  Valeska Haislip, LCSWA Clinical Social Worker [phone]

## 2023-12-27 NOTE — Telephone Encounter (Signed)
Attempt call regarding referral; Left HIPPA-compliant message to call back Mikle Sternberg from [facility] at  [phone] (Ashelyn Mccravy's office).    

## 2023-12-27 NOTE — Telephone Encounter (Signed)
 CSW met with patient in the hospital and patient requested referral for IBH. Placed order for [facility] referral. Dr. Barbra notified via [facility] order.  Mliss Sieve, RN 12/27/2023 10:18

## 2023-12-27 NOTE — Patient Instructions (Signed)

## 2023-12-29 LAB — BPAM RBC
Blood Product Expiration Date: 202512152359
Blood Product Expiration Date: 202512152359
Blood Product Expiration Date: 202512202359
Blood Product Expiration Date: 202512202359
ISSUE DATE / TIME: 202511241426
Unit Type and Rh: 202512202359
Unit Type and Rh: 5100
Unit Type and Rh: 5100
Unit Type and Rh: 6200
Unit Type and Rh: 6200

## 2023-12-29 LAB — TYPE AND SCREEN
ABO/RH(D): A POS
Antibody Screen: POSITIVE
Unit division: 0
Unit division: 0
Unit division: 0
Unit division: 0

## 2023-12-31 ENCOUNTER — Inpatient Hospital Stay (HOSPITAL_COMMUNITY)

## 2023-12-31 ENCOUNTER — Inpatient Hospital Stay (HOSPITAL_COMMUNITY): Admission: RE | Admit: 2023-12-31 | Source: Home / Self Care | Admitting: Family Medicine

## 2024-01-03 ENCOUNTER — Encounter: Admitting: Family Medicine

## 2024-01-05 ENCOUNTER — Telehealth (HOSPITAL_COMMUNITY): Payer: Self-pay | Admitting: *Deleted

## 2024-01-05 NOTE — Telephone Encounter (Signed)
 01/05/2024  Name: Sherry Robbins MRN: [id number] DOB: 1995-04-21  Reason for Call:  Transition of Care Hospital Discharge Call  Contact Status: Patient Contact Status: Message  Language assistant needed:          Follow-Up Questions:    Van Postnatal Depression Scale:  In the Past 7 Days:    PHQ2-9 Depression Scale:     Discharge Follow-up:    Post-discharge interventions: NA  Cleaven Demario,RN  01/05/2024 1525

## 2024-01-06 ENCOUNTER — Ambulatory Visit: Payer: Self-pay

## 2024-01-07 ENCOUNTER — Telehealth: Payer: Self-pay | Admitting: Clinical

## 2024-01-07 NOTE — Telephone Encounter (Signed)
Attempt call regarding referral; Left HIPPA-compliant message to call back Mikle Sternberg from [facility] at  [phone] (Ashelyn Mccravy's office).    

## 2024-01-14 ENCOUNTER — Telehealth: Payer: Self-pay | Admitting: Clinical

## 2024-01-14 NOTE — Telephone Encounter (Signed)
Attempt call regarding referral; Left HIPPA-compliant message to call back Mikle Sternberg from [facility] at  [phone] (Ashelyn Mccravy's office).    

## 2024-02-08 DIAGNOSIS — Z8632 Personal history of gestational diabetes: Secondary | ICD-10-CM

## 2024-02-09 ENCOUNTER — Ambulatory Visit: Admitting: Obstetrics and Gynecology

## 2024-02-09 ENCOUNTER — Other Ambulatory Visit
